# Patient Record
Sex: Male | Born: 1945 | Race: White | Hispanic: No | Marital: Married | Smoking: Never smoker
Health system: Southern US, Community
[De-identification: ages and names within clinical notes are randomized; demographics above are authoritative.]

## PROBLEM LIST (undated history)

## (undated) DIAGNOSIS — H409 Unspecified glaucoma: Secondary | ICD-10-CM

## (undated) DIAGNOSIS — J45909 Unspecified asthma, uncomplicated: Secondary | ICD-10-CM

## (undated) DIAGNOSIS — N400 Enlarged prostate without lower urinary tract symptoms: Secondary | ICD-10-CM

## (undated) DIAGNOSIS — E7800 Pure hypercholesterolemia, unspecified: Secondary | ICD-10-CM

## (undated) HISTORY — PX: TONSILLECTOMY: SUR1361

## (undated) HISTORY — PX: OTHER SURGICAL HISTORY: SHX169

---

## 2004-12-27 ENCOUNTER — Ambulatory Visit: Payer: Self-pay | Admitting: Internal Medicine

## 2005-01-10 ENCOUNTER — Ambulatory Visit: Payer: Self-pay | Admitting: Internal Medicine

## 2007-07-01 ENCOUNTER — Encounter: Payer: Self-pay | Admitting: Internal Medicine

## 2007-12-04 ENCOUNTER — Ambulatory Visit: Payer: Self-pay | Admitting: Gastroenterology

## 2007-12-10 ENCOUNTER — Ambulatory Visit: Payer: Self-pay | Admitting: Internal Medicine

## 2007-12-10 LAB — CONVERTED CEMR LAB
Albumin: 4 g/dL (ref 3.5–5.2)
Alkaline Phosphatase: 53 units/L (ref 39–117)
BUN: 20 mg/dL (ref 6–23)
Bilirubin Urine: NEGATIVE
Bilirubin, Direct: 0.1 mg/dL (ref 0.0–0.3)
Blood in Urine, dipstick: NEGATIVE
Calcium: 8.9 mg/dL (ref 8.4–10.5)
Cholesterol: 151 mg/dL (ref 0–200)
Eosinophils Absolute: 0.2 10*3/uL (ref 0.0–0.7)
GFR calc Af Amer: 110 mL/min
GFR calc non Af Amer: 91 mL/min
Glucose, Bld: 98 mg/dL (ref 70–99)
Glucose, Urine, Semiquant: NEGATIVE
HCT: 44.5 % (ref 39.0–52.0)
HDL: 29.8 mg/dL — ABNORMAL LOW (ref >39.0)
Hemoglobin: 15.2 g/dL (ref 13.0–17.0)
Ketones, urine, test strip: NEGATIVE
MCHC: 34.1 g/dL (ref 30.0–36.0)
MCV: 88.3 fL (ref 78.0–100.0)
Monocytes Absolute: 0.5 10*3/uL (ref 0.1–1.0)
Monocytes Relative: 8.2 % (ref 3.0–12.0)
Neutro Abs: 3.7 10*3/uL (ref 1.4–7.7)
Nitrite: NEGATIVE
Platelets: 151 10*3/uL (ref 150–400)
Potassium: 4.1 meq/L (ref 3.5–5.1)
RDW: 13.1 % (ref 11.5–14.6)
Sodium: 142 meq/L (ref 135–145)
Specific Gravity, Urine: 1.02
TSH: 2.05 microintl units/mL (ref 0.35–5.50)
Total Protein: 6.3 g/dL (ref 6.0–8.3)
Triglycerides: 61 mg/dL (ref 0–149)
pH: 7.5

## 2007-12-11 ENCOUNTER — Ambulatory Visit: Payer: Self-pay | Admitting: Gastroenterology

## 2007-12-21 ENCOUNTER — Ambulatory Visit: Payer: Self-pay | Admitting: Internal Medicine

## 2007-12-21 DIAGNOSIS — J309 Allergic rhinitis, unspecified: Secondary | ICD-10-CM | POA: Insufficient documentation

## 2007-12-21 DIAGNOSIS — E785 Hyperlipidemia, unspecified: Secondary | ICD-10-CM

## 2007-12-21 DIAGNOSIS — N400 Enlarged prostate without lower urinary tract symptoms: Secondary | ICD-10-CM | POA: Insufficient documentation

## 2008-01-06 ENCOUNTER — Encounter: Payer: Self-pay | Admitting: Internal Medicine

## 2008-02-17 ENCOUNTER — Encounter: Payer: Self-pay | Admitting: Internal Medicine

## 2008-02-29 ENCOUNTER — Encounter: Payer: Self-pay | Admitting: Internal Medicine

## 2009-06-13 ENCOUNTER — Encounter: Payer: Self-pay | Admitting: Internal Medicine

## 2009-12-01 ENCOUNTER — Ambulatory Visit: Payer: Self-pay | Admitting: Internal Medicine

## 2009-12-01 LAB — CONVERTED CEMR LAB
ALT: 19 units/L (ref 0–53)
BUN: 15 mg/dL (ref 6–23)
Basophils Absolute: 0 10*3/uL (ref 0.0–0.1)
Bilirubin Urine: NEGATIVE
Chloride: 104 meq/L (ref 96–112)
Cholesterol: 139 mg/dL (ref 0–200)
Creatinine, Ser: 0.8 mg/dL (ref 0.4–1.5)
Eosinophils Absolute: 0.2 10*3/uL (ref 0.0–0.7)
Glucose, Bld: 87 mg/dL (ref 70–99)
HCT: 41.5 % (ref 39.0–52.0)
Leukocytes, UA: NEGATIVE
Lymphs Abs: 1.4 10*3/uL (ref 0.7–4.0)
MCHC: 33.9 g/dL (ref 30.0–36.0)
MCV: 85.1 fL (ref 78.0–100.0)
Monocytes Absolute: 0.4 10*3/uL (ref 0.1–1.0)
Neutrophils Relative %: 60.7 % (ref 43.0–77.0)
Nitrite: NEGATIVE
PSA: 4.63 ng/mL — ABNORMAL HIGH (ref 0.10–4.00)
Platelets: 295 10*3/uL (ref 150.0–400.0)
Potassium: 4.3 meq/L (ref 3.5–5.1)
RDW: 15.4 % — ABNORMAL HIGH (ref 11.5–14.6)
Specific Gravity, Urine: >=1.03 (ref 1.000–1.030)
TSH: 1.74 microintl units/mL (ref 0.35–5.50)
Total Bilirubin: 0.4 mg/dL (ref 0.3–1.2)
Triglycerides: 39 mg/dL (ref 0.0–149.0)
pH: 5 (ref 5.0–8.0)

## 2009-12-12 ENCOUNTER — Encounter: Payer: Self-pay | Admitting: Internal Medicine

## 2009-12-12 ENCOUNTER — Ambulatory Visit: Payer: Self-pay | Admitting: Internal Medicine

## 2009-12-12 DIAGNOSIS — J4520 Mild intermittent asthma, uncomplicated: Secondary | ICD-10-CM | POA: Insufficient documentation

## 2009-12-12 DIAGNOSIS — G4710 Hypersomnia, unspecified: Secondary | ICD-10-CM

## 2009-12-20 ENCOUNTER — Encounter: Payer: Self-pay | Admitting: Internal Medicine

## 2009-12-27 ENCOUNTER — Ambulatory Visit: Payer: Self-pay | Admitting: Pulmonary Disease

## 2010-04-18 ENCOUNTER — Encounter: Payer: Self-pay | Admitting: Internal Medicine

## 2010-05-01 NOTE — Letter (Signed)
Summary: [facility]  [facility]   Imported By: Maryln Gottron 06/16/2009 13:51:20  _____________________________________________________________________  External Attachment:    Type:   Image     Comment:   External Document

## 2010-05-01 NOTE — Assessment & Plan Note (Signed)
Summary: NEW PT CPX/[employer]HC/SHINGLES SHOT/#/LB   Vital Signs:  Patient profile:   65 year old male Height:      70 inches Weight:      249.38 pounds BMI:     35.91 O2 Sat:      95 % on Room air Temp:     97.6 degrees F oral Pulse rate:   88 / minute BP sitting:   132 / 82  (left arm) Cuff size:   large  Vitals Entered By: Zella Ball Ewing CMA Duncan Dull) (December 12, 2009 9:39 AM)  O2 Flow:  Room air  CC: Adult Physical, New Patient/RE   CC:  Adult Physical and New Patient/RE.  History of Present Illness: here for wellness;  not much snoring overall but has gradually gained wt  pk wt was 261, now back to 249 wiht mod adkins diet;  Pt denies CP, worsening sob, doe, wheezing, orthopnea, pnd, worsening LE edema, palps, dizziness or syncope  Has someoccasional muscle aches .  Pt denies new neuro symptoms such as headache, facial or extremity weakness  No fever, wt loss, night sweats, loss of appetite or other constitutional symptoms  Has a walker and total gym at home, likes to hunt for deer in the fall.    Preventive Screening-Counseling & Management      Drug Use:  no.    Problems Prior to Update: 1)  Hyperlipidemia  (ICD-272.4) 2)  Benign Prostatic Hypertrophy  (ICD-600.00) 3)  Allergic Rhinitis  (ICD-477.9)  Medications Prior to Update: 1)  Flomax 0.4 Mg Xr24h-Cap (Tamsulosin Hcl) .Marland Kitchen.. 1 Once Daily 2)  Adult Aspirin Ec Low Strength 81 Mg Tbec (Aspirin) .Marland Kitchen.. 1 Once Daily 3)  Fish Oil 1000 Mg Caps (Omega-3 Fatty Acids)  Current Medications (verified): 1)  Flomax 0.4 Mg Xr24h-Cap (Tamsulosin Hcl) .Marland Kitchen.. 1 Once At Bedtime 2)  Adult Aspirin Ec Low Strength 81 Mg Tbec (Aspirin) .Marland Kitchen.. 1 Once Daily 3)  Fish Oil 1000 Mg Caps (Omega-3 Fatty Acids) 4)  Allergy Shots  Allergies (verified): No Known Drug Allergies  Past History:  Family History: Last updated: 01-02-08  father died age 74, history of hypertension, coronary artery disease mother died at 77, gastric cancer  One brother  died age 104 of MS  Social History: Last updated: 12/12/2009 Married Never Smoked Regular exercise-no one son with  grandchildren Alcohol use-no Drug use-no  Risk Factors: Exercise: no (Jan 02, 2008)  Risk Factors: Smoking Status: never (01-02-2008)  Past Medical History: Allergic rhinitis Benign prostatic hypertrophy (Tannenbaum) Hyperlipidemia Asthma Elevated PSA - Dr Patsi Sears [place]  Past Surgical History: Tonsillectomy s/p prostate biopsy x 2  - negative  Family History: Reviewed history from Jan 02, 2008 and no changes required.  father died age 53, history of hypertension, coronary artery disease mother died at 61, gastric cancer  One brother died age 23 of MS  Social History: Reviewed history from 2008/01/02 and no changes required. Married Never Smoked Regular exercise-no one son with  grandchildren Alcohol use-no Drug use-no Drug Use:  no  Review of Systems  The patient denies anorexia, fever, weight loss, weight gain, vision loss, decreased hearing, hoarseness, chest pain, syncope, dyspnea on exertion, peripheral edema, prolonged cough, headaches, hemoptysis, abdominal pain, melena, hematochezia, severe indigestion/heartburn, hematuria, muscle weakness, suspicious skin lesions, transient blindness, difficulty walking, depression, unusual weight change, abnormal bleeding, enlarged lymph nodes, and angioedema.         all otherwise negative per pt -  has appt sept 21; takes the flomax in the AM  Physical  Exam  General:  alert and overweight-appearing.   Head:  normocephalic and atraumatic.   Eyes:  vision grossly intact, pupils equal, and pupils round.   Ears:  R ear normal and L ear normal.   Nose:  no external deformity and no nasal discharge.   Mouth:  no gingival abnormalities and pharynx pink and moist.   Neck:  supple and no masses.   Lungs:  normal respiratory effort and normal breath sounds.   Heart:  normal rate and regular rhythm.     Abdomen:  soft, non-tender, and normal bowel sounds.   Msk:  no joint tenderness and no joint swelling.   Extremities:  no edema, no erythema  Neurologic:  cranial nerves II-XII intact and strength normal in all extremities.   Skin:  color normal and no rashes.   Psych:  not anxious appearing and not depressed appearing.     Impression & Recommendations:  Problem # 1:  Preventive Health Care (ICD-V70.0) Overall doing well, age appropriate education and counseling updated and referral for appropriate preventive services done unless declined, immunizations up to date or declined, diet counseling done if overweight, urged to quit smoking if smokes , most recent labs reviewed and current ordered if appropriate, ecg reviewed or declined (interpretation per ECG scanned in the EMR if done); information regarding Medicare Prevention requirements given if appropriate; speciality referrals updated as appropriate   Problem # 2:  HYPERSOMNIA (ICD-780.54)  ok to refer to pulm - ? OSA  Orders: Pulmonary Referral (Pulmonary)  Problem # 3:  BENIGN PROSTATIC HYPERTROPHY (ICD-600.00)  His updated medication list for this problem includes:    Flomax 0.4 Mg Xr24h-cap (Tamsulosin hcl) .Marland Kitchen... 1 once at bedtime with noctuiria x 4 per night, with sleep disruption - to start with taking the flomax at night, and f/u with urology later this motnh  Complete Medication List: 1)  Flomax 0.4 Mg Xr24h-cap (Tamsulosin hcl) .Marland Kitchen.. 1 once at bedtime 2)  Adult Aspirin Ec Low Strength 81 Mg Tbec (Aspirin) .Marland Kitchen.. 1 once daily 3)  Fish Oil 1000 Mg Caps (Omega-3 fatty acids) 4)  Allergy Shots   Other Orders: Tdap => 69yrs IM (16109) Admin 1st Vaccine (60454) Admin 1st Vaccine (09811) Flu Vaccine 64yrs + [phone]) EKG w/ Interpretation (93000)  Patient Instructions: 1)  you had the flu shot and tetanus shot today 2)  you had the shingles shot today 3)  Continue all previous medications as before this visit , except take  the flomax before bedtime to see if this helps getting up so much at night 4)  You will be contacted about the referral(s) to: pulmonary for the possible sleep apnea 5)  Please keep your appt with Urology as planned 6)  Your labs were faxed to urology today 7)  Please schedule a follow-up appointment in 1 year or sooner if needed   Immunizations Administered:  Tetanus Vaccine:    Vaccine Type: Tdap    Site: right deltoid    Mfr: GlaxoSmithKline    Dose: 0.5 ml    Route: IM    Given by: Zella Ball Ewing CMA (AAMA)    Exp. Date: 09/29/2011    Lot #: [id number]    VIS given: 02/17/08 version given December 12, 2009.    Flu Vaccine Consent Questions     Do you have a history of severe allergic reactions to this vaccine? no    Any prior history of allergic reactions to egg and/or gelatin? no    Do you  have a sensitivity to the preservative Thimersol? no    Do you have a past history of Guillan-Barre Syndrome? no    Do you currently have an acute febrile illness? no    Have you ever had a severe reaction to latex? no    Vaccine information given and explained to patient? yes    Are you currently pregnant? no    Lot Number:[id number]   Exp Date:09/29/2010   Site Given  Left Deltoid IMlu    Immunizations Administered:  Tetanus Vaccine:    Vaccine Type: Tdap    Site: right deltoid    Mfr: GlaxoSmithKline    Dose: 0.5 ml    Route: IM    Given by: Zella Ball Ewing CMA (AAMA)    Exp. Date: 09/29/2011    Lot #: [id number]    VIS given: 02/17/08 version given December 12, 2009.   Appended Document: Immunization Entry      Immunizations Administered:  Zostavax # 1:    Vaccine Type: Zostavax    Site: right deltoid    Mfr: Merck    Dose: 0.5 ml    Route: [place]    Given by: Zella Ball Ewing CMA (AAMA)    Exp. Date: 10/25/2010    Lot #: [id number]    VIS given: 01/11/05 given December 12, 2009.

## 2010-05-01 NOTE — Letter (Signed)
Summary: [facility]  [facility]   Imported By: Lennie Odor 12/25/2009 10:28:54  _____________________________________________________________________  External Attachment:    Type:   Image     Comment:   External Document

## 2010-05-01 NOTE — Assessment & Plan Note (Signed)
Summary: HYPERSOMNIA/APC   Visit Type:  Initial Consult Copy to:  Dr. Jonny Ruiz Primary Provider/Referring Provider:  Dr. Oliver Barre  CC:  Sleep consult...epworth score is 3..  History of Present Illness: 65/M for evaluation of obstructive sleep apnea . Seems that he was referred mainly for nocturia  - he attributes to enlarged prostate Epworth Sleepiness Score 3/24 naps on weekends, wife has not noted snoring 3-4 awakenings for nocturia  sleeps on his back or right side, 2 pillows, oob at 0530 - sluggish, dry mouth, 1 cup hot tea in am, 2-3 cans of soda, iced tea, no coffee, lost 25 lbs in  6 months  Preventive Screening-Counseling & Management  Alcohol-Tobacco     Alcohol drinks/day: 0     Smoking Status: never  Pulmonary Sleep Consultation Name CLIFFTON SPRADLEY DOB [birth year] MRN [id number] Date 12/27/2009  History of Present Illness: Enlarged prostate...up 3 to 4 times from sleep to urinate  What time do you typically go to bed?(between what hours): 9:30pm to 10:30pm  How long does it take you to fall asleep? 15 minutes  How many times during the night do you wake up? 3 or 4  What time do you get out of bed to start your day? 5:30am  Do you drive or operate heavy machinery in your occupation? no  How much has your weight changed (up or down) over the past two years? (in pounds): down 25 lbs with diet change  Have you ever had a sleep study before?  If yes,when and where: no  Do you currently use CPAP ? If so , at what pressure? no  Do you wear oxygen at any time? If yes, how many liters per minute? no Current Medications (verified): 1)  Flomax 0.4 Mg Xr24h-Cap (Tamsulosin Hcl) .Marland Kitchen.. 1 By Mouth Two Times A Day 2)  Adult Aspirin Ec Low Strength 81 Mg Tbec (Aspirin) .Marland Kitchen.. 1 By Mouth Daily 3)  Fish Oil 1000 Mg Caps (Omega-3 Fatty Acids) .Marland Kitchen.. 1 By Mouth Daily 4)  Allergy Shots 5)  Osteo Bi-Flex Adv Joint Shield  Tabs (Misc Natural Products) .Marland Kitchen.. 1 By Mouth Daily 6)  [facility]vs  Spectravite Senior  Tabs (Multiple Vitamins-Minerals) .Marland Kitchen.. 1 By Mouth Daily 7)  Vit E-Vit C-Beta Carotene [phone] Tabs (Multiple Vitamin) .Marland Kitchen.. 1 By Mouth Daily 8)  Acidophilus Probiotic 100 Mg Caps (Lactobacillus) .Marland Kitchen.. 1 By Mouth Daily 9)  Vitamin D3 1000 Unit Caps (Cholecalciferol) .Marland Kitchen.. 1 By Mouth Daily 10)  Vitamin B-12 1000 Mcg Tabs (Cyanocobalamin) .Marland Kitchen.. 1 By Mouth Daily 11)  Prostavar .Marland Kitchen.. 1 or 2 By Mouth Daily  Allergies (verified): No Known Drug Allergies  Past History:  Past Medical History: Last updated: 12/12/2009 Allergic rhinitis Benign prostatic hypertrophy ([place]annenbaum) Hyperlipidemia Asthma Elevated PSA - Dr Patsi Sears [place]  Past Surgical History: Last updated: 12/12/2009 Tonsillectomy s/p prostate biopsy x 2  - negative  Social History: Married Never Smoked Regular exercise-no one son with  grandchildren Alcohol use-no Drug use-no works as a Arboriculturist drinks/day:  0  Review of Systems       The patient complains of shortness of breath with activity, weight change, and joint stiffness or pain.  The patient denies shortness of breath at rest, productive cough, non-productive cough, coughing up blood, chest pain, irregular heartbeats, acid heartburn, indigestion, loss of appetite, abdominal pain, difficulty swallowing, sore throat, tooth/dental problems, headaches, nasal congestion/difficulty breathing through nose, sneezing, itching, ear ache, anxiety, depression, hand/feet swelling, rash, change in color of mucus, and fever.  Vital Signs:  Patient profile:   65 year old male Height:      70 inches (177.80 cm) Weight:      251.50 pounds (114.32 kg) BMI:     36.22 O2 Sat:      96 % on Room air Temp:     98.1 degrees F (36.72 degrees C) oral Pulse rate:   86 / minute BP sitting:   118 / 80  (left arm) Cuff size:   large  Vitals Entered By: Michel Bickers CMA (December 27, 2009 9:55 AM)  O2 Sat at Rest %:  96 O2 Flow:  Room air CC:  Sleep consult...epworth score is 3. Comments Medications reviewed with patient Daytime phone verified. Michel Bickers [facility]  December 27, 2009 9:56 AM   Physical Exam  Additional Exam:  wt 251 December 29, 2009  Gen. Pleasant, well-nourished, in no distress, normal affect ENT - no lesions, no post nasal drip, class 2 airway Neck: No JVD, no thyromegaly, no carotid bruits Lungs: no use of accessory muscles, no dullness to percussion, clear without rales or rhonchi  Cardiovascular: Rhythm regular, heart sounds  normal, no murmurs or gallops, no peripheral edema Abdomen: soft and non-tender, no hepatosplenomegaly, BS normal. Musculoskeletal: No deformities, no cyanosis or clubbing Neuro:  alert, non focal     Impression & Recommendations:  Problem # 1:  HYPERSOMNIA (ICD-780.54)  The pathophysiology of obstructive sleep apnea, it's cardiovascular consequences and modes of treatment including CPAP were discussed with the patient in great detail.  After discussion with him,  We will not pursue a sleep study at this time - if any red flags show up, let us know - snoring, excessive daytime sleepiness, frequent BR visits that your urologist thinks is not because of your prostate Continue weight loss program If wife has concerns , she wil call to provide further objective history  Orders: Consultation Level III (16109)  Medications Added to Medication List This Visit: 1)  Flomax 0.4 Mg Xr24h-cap (Tamsulosin hcl) .Marland Kitchen.. 1 by mouth two times a day 2)  Adult Aspirin Ec Low Strength 81 Mg Tbec (Aspirin) .Marland Kitchen.. 1 by mouth daily 3)  Fish Oil 1000 Mg Caps (Omega-3 fatty acids) .Marland Kitchen.. 1 by mouth daily 4)  Osteo Bi-flex Adv Joint Shield Tabs (Misc natural products) .Marland Kitchen.. 1 by mouth daily 5)  Cvs Spectravite Senior Tabs (Multiple vitamins-minerals) .Marland Kitchen.. 1 by mouth daily 6)  Vit E-vit C-beta Carotene [phone] Tabs (Multiple vitamin) .Marland Kitchen.. 1 by mouth daily 7)  Acidophilus Probiotic 100 Mg Caps  (Lactobacillus) .Marland Kitchen.. 1 by mouth daily 8)  Vitamin D3 1000 Unit Caps (Cholecalciferol) .Marland Kitchen.. 1 by mouth daily 9)  Vitamin B-12 1000 Mcg Tabs (Cyanocobalamin) .Marland Kitchen.. 1 by mouth daily 10)  Prostavar  .Marland Kitchen.. 1 or 2 by mouth daily  Patient Instructions: 1)  Copy sent to: dr Jonny Ruiz 2)  Please schedule a follow-up appointment as needed. 3)  We will not pursue a sleep study at this time - if any red flags show up, let us know - snoring, excessive daytime sleepiness, frequent BR visits that your urologist thinks is not because of your prostate 4)  Continue weight los program 5)  Let us know if your wife notes that you snore loud or stop breathing in your sleep

## 2010-05-03 NOTE — Letter (Signed)
Summary: [facility]  [facility]   Imported By: Lennie Odor 04/25/2010 14:29:09  _____________________________________________________________________  External Attachment:    Type:   Image     Comment:   External Document

## 2014-04-11 ENCOUNTER — Ambulatory Visit (INDEPENDENT_AMBULATORY_CARE_PROVIDER_SITE_OTHER): Payer: 59 | Admitting: *Deleted

## 2014-04-11 ENCOUNTER — Ambulatory Visit: Payer: 59 | Admitting: *Deleted

## 2014-04-11 DIAGNOSIS — Z23 Encounter for immunization: Secondary | ICD-10-CM

## 2015-01-04 ENCOUNTER — Telehealth: Payer: Self-pay | Admitting: Internal Medicine

## 2015-01-04 LAB — HM DIABETES EYE EXAM

## 2015-01-04 NOTE — Telephone Encounter (Signed)
Patient wants to reestablish with [place] and has asked if you can take him on as a new patient.  Please advise

## 2015-01-07 NOTE — Telephone Encounter (Signed)
Yes, ok with me 

## 2015-01-09 NOTE — Telephone Encounter (Signed)
appt made

## 2015-01-18 ENCOUNTER — Other Ambulatory Visit (INDEPENDENT_AMBULATORY_CARE_PROVIDER_SITE_OTHER): Payer: Medicare Other

## 2015-01-18 ENCOUNTER — Encounter: Payer: Self-pay | Admitting: Internal Medicine

## 2015-01-18 ENCOUNTER — Ambulatory Visit (INDEPENDENT_AMBULATORY_CARE_PROVIDER_SITE_OTHER): Payer: Medicare Other | Admitting: Internal Medicine

## 2015-01-18 VITALS — BP 120/70 | HR 80 | Temp 97.6°F | Resp 16 | Ht 70.0 in | Wt 273.0 lb

## 2015-01-18 DIAGNOSIS — E669 Obesity, unspecified: Secondary | ICD-10-CM

## 2015-01-18 DIAGNOSIS — E785 Hyperlipidemia, unspecified: Secondary | ICD-10-CM | POA: Diagnosis not present

## 2015-01-18 DIAGNOSIS — J4520 Mild intermittent asthma, uncomplicated: Secondary | ICD-10-CM

## 2015-01-18 DIAGNOSIS — Z23 Encounter for immunization: Secondary | ICD-10-CM

## 2015-01-18 DIAGNOSIS — Z1211 Encounter for screening for malignant neoplasm of colon: Secondary | ICD-10-CM

## 2015-01-18 DIAGNOSIS — N400 Enlarged prostate without lower urinary tract symptoms: Secondary | ICD-10-CM

## 2015-01-18 DIAGNOSIS — Z0000 Encounter for general adult medical examination without abnormal findings: Secondary | ICD-10-CM | POA: Diagnosis not present

## 2015-01-18 DIAGNOSIS — G4710 Hypersomnia, unspecified: Secondary | ICD-10-CM

## 2015-01-18 DIAGNOSIS — R0609 Other forms of dyspnea: Secondary | ICD-10-CM

## 2015-01-18 LAB — COMPREHENSIVE METABOLIC PANEL
ALT: 20 U/L (ref 0–53)
AST: 17 U/L (ref 0–37)
Albumin: 4.5 g/dL (ref 3.5–5.2)
Alkaline Phosphatase: 72 U/L (ref 39–117)
BILIRUBIN TOTAL: 0.5 mg/dL (ref 0.2–1.2)
BUN: 20 mg/dL (ref 6–23)
CALCIUM: 9.6 mg/dL (ref 8.4–10.5)
CHLORIDE: 106 meq/L (ref 96–112)
CO2: 26 meq/L (ref 19–32)
Creatinine, Ser: 0.87 mg/dL (ref 0.40–1.50)
GFR: 92.22 mL/min (ref >60.00)
Glucose, Bld: 97 mg/dL (ref 70–99)
POTASSIUM: 4.2 meq/L (ref 3.5–5.1)
Sodium: 141 mEq/L (ref 135–145)
Total Protein: 7 g/dL (ref 6.0–8.3)

## 2015-01-18 LAB — CBC WITH DIFFERENTIAL/PLATELET
BASOS ABS: 0 10*3/uL (ref 0.0–0.1)
Basophils Relative: 0.4 % (ref 0.0–3.0)
Eosinophils Absolute: 0.1 10*3/uL (ref 0.0–0.7)
Eosinophils Relative: 1.9 % (ref 0.0–5.0)
HEMATOCRIT: 48 % (ref 39.0–52.0)
Hemoglobin: 15.9 g/dL (ref 13.0–17.0)
LYMPHS PCT: 25.9 % (ref 12.0–46.0)
Lymphs Abs: 1.9 10*3/uL (ref 0.7–4.0)
MCHC: 33.2 g/dL (ref 30.0–36.0)
MCV: 88.9 fl (ref 78.0–100.0)
MONOS PCT: 8.1 % (ref 3.0–12.0)
Monocytes Absolute: 0.6 10*3/uL (ref 0.1–1.0)
NEUTROS ABS: 4.7 10*3/uL (ref 1.4–7.7)
Neutrophils Relative %: 63.7 % (ref 43.0–77.0)
PLATELETS: 254 10*3/uL (ref 150.0–400.0)
RBC: 5.39 Mil/uL (ref 4.22–5.81)
RDW: 13.6 % (ref 11.5–15.5)
WBC: 7.3 10*3/uL (ref 4.0–10.5)

## 2015-01-18 LAB — LIPID PANEL
CHOLESTEROL: 183 mg/dL (ref 0–200)
HDL: 41.9 mg/dL (ref >39.00)
LDL Cholesterol: 121 mg/dL — ABNORMAL HIGH (ref 0–99)
NonHDL: 140.69
TRIGLYCERIDES: 99 mg/dL (ref 0.0–149.0)
Total CHOL/HDL Ratio: 4
VLDL: 19.8 mg/dL (ref 0.0–40.0)

## 2015-01-18 LAB — HEMOGLOBIN A1C: HEMOGLOBIN A1C: 6 % (ref 4.6–6.5)

## 2015-01-18 LAB — TSH: TSH: 2.37 u[IU]/mL (ref 0.35–4.50)

## 2015-01-18 LAB — PSA: PSA: 4.7 ng/mL — ABNORMAL HIGH (ref 0.10–4.00)

## 2015-01-18 NOTE — Patient Instructions (Signed)

## 2015-01-18 NOTE — Progress Notes (Signed)
Subjective:  Patient ID: Mario Donovan, male    DOB: 1945/05/02  Age: 69 y.o. MRN: [id number]  CC: Annual Exam and Fatigue  New to me for a CPX   HPI CORDARRIUS COAD presents for a CPX but also complains of fatigue and mild DOE for several years.   No outpatient prescriptions prior to visit.   No facility-administered medications prior to visit.    ROS Review of Systems  Constitutional: Positive for fatigue and unexpected weight change (some wt gain). Negative for fever, chills, diaphoresis, activity change and appetite change.  HENT: Negative.   Eyes: Negative.   Respiratory: Positive for shortness of breath. Negative for apnea, cough, choking, chest tightness, wheezing and stridor.        His wife says he does not snore and she has not seen any evidence of sleep apnea but she tells me that he is a "light sleeper"  Cardiovascular: Negative.  Negative for chest pain, palpitations and leg swelling.  Gastrointestinal: Negative.  Negative for nausea, vomiting, abdominal pain, diarrhea, constipation and blood in stool.  Endocrine: Negative.   Genitourinary: Negative.   Musculoskeletal: Negative.  Negative for myalgias, back pain and joint swelling.  Skin: Negative.  Negative for color change and rash.  Allergic/Immunologic: Negative.   Neurological: Negative.   Hematological: Negative.  Negative for adenopathy. Does not bruise/bleed easily.  Psychiatric/Behavioral: Negative.  Negative for sleep disturbance, dysphoric mood and agitation. The patient is not nervous/anxious.     Objective:  BP 120/70 mmHg  Pulse 80  Temp(Src) 97.6 F (36.4 C) (Oral)  Resp 16  Ht 5\' 10"  (1.778 m)  Wt 273 lb (123.832 kg)  BMI 39.17 kg/m2  SpO2 96%  BP Readings from Last 3 Encounters:  01/18/15 120/70  12/27/09 118/80  12/12/09 132/82    Wt Readings from Last 3 Encounters:  01/18/15 273 lb (123.832 kg)  12/27/09 251 lb 8 oz (114.08 kg)  12/12/09 249 lb 6.1 oz (113.119 kg)     Physical Exam  Constitutional: He is oriented to person, place, and time. He appears well-developed and well-nourished. No distress.  HENT:  Head: Normocephalic and atraumatic.  Mouth/Throat: Oropharynx is clear and moist.  Eyes: Conjunctivae are normal. Right eye exhibits no discharge. Left eye exhibits no discharge. No scleral icterus.  Neck: Normal range of motion. Neck supple. No JVD present. No tracheal deviation present. No thyromegaly present.  Cardiovascular: Normal rate, regular rhythm, normal heart sounds and intact distal pulses.  Exam reveals no gallop and no friction rub.   No murmur heard. Pulses:      Carotid pulses are 1+ on the right side, and 1+ on the left side.      Radial pulses are 1+ on the right side, and 1+ on the left side.       Femoral pulses are 1+ on the right side, and 1+ on the left side.      Popliteal pulses are 1+ on the right side, and 1+ on the left side.       Dorsalis pedis pulses are 1+ on the right side, and 1+ on the left side.       Posterior tibial pulses are 1+ on the right side, and 1+ on the left side.  EKG - + artifact but shows NSR with no LVH, Q waves or ST/T wave changes  Pulmonary/Chest: Effort normal and breath sounds normal. No stridor. No respiratory distress. He has no wheezes. He has no rales. He exhibits  no tenderness.  Abdominal: Soft. Bowel sounds are normal. He exhibits no distension and no mass. There is no tenderness. There is no rebound and no guarding.  Genitourinary:  He refused GU and rectal exams, he tells me that he will see his urologist on Nov 2 and will have the exams done then  Musculoskeletal: Normal range of motion. He exhibits no edema or tenderness.  Lymphadenopathy:    He has no cervical adenopathy.  Neurological: He is oriented to person, place, and time.  Skin: Skin is warm and dry. No rash noted. He is not diaphoretic. No erythema. No pallor.  Psychiatric: He has a normal mood and affect. His behavior is  normal. Judgment and thought content normal.  Vitals reviewed.   Lab Results  Component Value Date   WBC 7.3 01/18/2015   HGB 15.9 01/18/2015   HCT 48.0 01/18/2015   PLT 254.0 01/18/2015   GLUCOSE 97 01/18/2015   CHOL 183 01/18/2015   TRIG 99.0 01/18/2015   HDL 41.90 01/18/2015   LDLCALC 121* 01/18/2015   ALT 20 01/18/2015   AST 17 01/18/2015   NA 141 01/18/2015   K 4.2 01/18/2015   CL 106 01/18/2015   CREATININE 0.87 01/18/2015   BUN 20 01/18/2015   CO2 26 01/18/2015   TSH 2.37 01/18/2015   PSA 4.70* 01/18/2015   HGBA1C 6.0 01/18/2015    No results found.  Assessment & Plan:   Caidin was seen today for annual exam and fatigue.  Diagnoses and all orders for this visit:  BPH (benign prostatic hyperplasia)- PSA is slightly elevated, he sees his urologist soon -     PSA; Future -     Urinalysis, Routine w reflex microscopic (not at [facility]); Future  Hyperlipidemia with target LDL less than 130- Framingham risk score is 12% so will ask him to start a statin -     Lipid panel; Future -     Comprehensive metabolic panel; Future -     CBC with Differential/Platelet; Future -     TSH; Future  Asthma, mild intermittent, well-controlled -     CBC with Differential/Platelet; Future  Routine general medical examination at a health care facility -     Hepatitis C antibody; Future -     EKG 12-Lead  HYPERSOMNIA  Obesity (BMI 35.0-39.9 without comorbidity) ([place])- he agrees to work on his lifestyle modifications -     Hemoglobin A1c; Future  Colon cancer screening -     Ambulatory referral to Gastroenterology  Need for influenza vaccination -     Flu Vaccine QUAD 36+ mos IM  Need for vaccination with 13-polyvalent pneumococcal conjugate vaccine -     Pneumococcal conjugate vaccine 13-valent  DOE (dyspnea on exertion)- EKG is normal, this may be his poor conditioning and obesity but he has a FH of CAD so I have asked him to have an ETT done to screen for CAD -      Exercise Tolerance Test; Future  See AVS for instructions about healthy living and anticipatory guidance. I am having Mr. Heal maintain his SYMBICORT, tamsulosin, aspirin, diphenhydrAMINE, multivitamin, and Safflower Oil.  Meds ordered this encounter  Medications  . SYMBICORT 160-4.5 MCG/ACT inhaler    Sig: INHALE 2 PUFFS TWICE A DAY INHALATION    Refill:  0  . tamsulosin (FLOMAX) 0.4 MG CAPS capsule    Sig: TAKE 2 CAPSULE DAILY    Refill:  8  . aspirin 81 MG tablet    Sig:  Take 81 mg by mouth daily.  . diphenhydrAMINE (BENADRYL) 25 mg capsule    Sig: Take 25 mg by mouth every 6 (six) hours as needed.  . Multiple Vitamin (MULTIVITAMIN) tablet    Sig: Take 1 tablet by mouth daily.  . Safflower Oil OIL    Sig: Take by mouth daily.     Follow-up: Return in about 3 months (around 04/20/2015).  Scarlette Calico, MD

## 2015-01-18 NOTE — Progress Notes (Signed)
Pre visit review using our clinic review tool, if applicable. No additional management support is needed unless otherwise documented below in the visit note. 

## 2015-01-19 ENCOUNTER — Encounter: Payer: Self-pay | Admitting: Internal Medicine

## 2015-01-19 DIAGNOSIS — R0609 Other forms of dyspnea: Secondary | ICD-10-CM

## 2015-01-19 LAB — HEPATITIS C ANTIBODY: HCV AB: NEGATIVE

## 2015-01-19 MED ORDER — ATORVASTATIN CALCIUM 20 MG PO TABS: 20.0000 mg | ORAL_TABLET | Freq: Every day | ORAL | Status: DC

## 2015-01-19 NOTE — Assessment & Plan Note (Signed)

## 2015-01-20 ENCOUNTER — Encounter: Payer: Self-pay | Admitting: Internal Medicine

## 2015-02-14 ENCOUNTER — Telehealth (HOSPITAL_COMMUNITY): Payer: Self-pay

## 2015-02-14 NOTE — Telephone Encounter (Signed)
Encounter complete. 

## 2015-02-15 ENCOUNTER — Telehealth (HOSPITAL_COMMUNITY): Payer: Self-pay

## 2015-02-15 NOTE — Telephone Encounter (Signed)
Encounter complete. 

## 2015-02-17 ENCOUNTER — Ambulatory Visit (HOSPITAL_COMMUNITY)
Admission: RE | Admit: 2015-02-17 | Discharge: 2015-02-17 | Disposition: A | Discharge status: Home / Self Care | Payer: Medicare Other | Source: Ambulatory Visit | Attending: Cardiovascular Disease | Admitting: Cardiovascular Disease

## 2015-02-17 ENCOUNTER — Encounter: Payer: Self-pay | Admitting: Internal Medicine

## 2015-02-17 DIAGNOSIS — R0609 Other forms of dyspnea: Secondary | ICD-10-CM | POA: Diagnosis present

## 2015-02-17 LAB — EXERCISE TOLERANCE TEST
CSEPEW: 10.4 METS
CSEPHR: 103 %
CSEPPHR: 155 {beats}/min
Exercise duration (min): 9 min
Exercise duration (sec): 1 s
MPHR: 151 {beats}/min
RPE: 17
Rest HR: 83 {beats}/min

## 2015-02-22 ENCOUNTER — Other Ambulatory Visit: Payer: Self-pay | Admitting: Internal Medicine

## 2015-02-22 DIAGNOSIS — R7303 Prediabetes: Secondary | ICD-10-CM

## 2015-02-22 DIAGNOSIS — E785 Hyperlipidemia, unspecified: Secondary | ICD-10-CM

## 2015-03-13 ENCOUNTER — Other Ambulatory Visit: Payer: Self-pay | Admitting: Urology

## 2015-03-15 ENCOUNTER — Encounter: Payer: Self-pay | Admitting: Internal Medicine

## 2015-03-15 ENCOUNTER — Ambulatory Visit (INDEPENDENT_AMBULATORY_CARE_PROVIDER_SITE_OTHER): Payer: Medicare Other | Admitting: Internal Medicine

## 2015-03-15 VITALS — BP 118/78 | HR 78 | Temp 97.8°F | Resp 16 | Ht 70.0 in | Wt 264.0 lb

## 2015-03-15 DIAGNOSIS — J4520 Mild intermittent asthma, uncomplicated: Secondary | ICD-10-CM

## 2015-03-15 DIAGNOSIS — Z23 Encounter for immunization: Secondary | ICD-10-CM

## 2015-03-15 DIAGNOSIS — E785 Hyperlipidemia, unspecified: Secondary | ICD-10-CM

## 2015-03-15 NOTE — Progress Notes (Signed)
Subjective:  Patient ID: Mario Donovan, male    DOB: 10-04-1945  Age: 69 y.o. MRN: [id number]  CC: Asthma   HPI ANDRO GROSSER presents for an asthma follow-up. The nocturnal wheezing has resolved with the use of Symbicort. He feels well today and offers no complaints.  Outpatient Prescriptions Prior to Visit  Medication Sig Dispense Refill  . aspirin 81 MG tablet Take 81 mg by mouth daily.    Marland Kitchen atorvastatin (LIPITOR) 20 MG tablet Take 1 tablet (20 mg total) by mouth daily. 90 tablet 3  . diphenhydrAMINE (BENADRYL) 25 mg capsule Take 25 mg by mouth every 6 (six) hours as needed.    . Multiple Vitamin (MULTIVITAMIN) tablet Take 1 tablet by mouth daily.    . Safflower Oil OIL Take by mouth daily.    . SYMBICORT 160-4.5 MCG/ACT inhaler INHALE 2 PUFFS TWICE A DAY INHALATION  0  . tamsulosin (FLOMAX) 0.4 MG CAPS capsule TAKE 2 CAPSULE DAILY  8   No facility-administered medications prior to visit.    ROS Review of Systems  Constitutional: Negative.  Negative for fever, chills, diaphoresis, appetite change and fatigue.  HENT: Negative.   Eyes: Negative.   Respiratory: Negative.  Negative for cough, choking, chest tightness, shortness of breath and stridor.   Cardiovascular: Negative.  Negative for chest pain, palpitations and leg swelling.  Gastrointestinal: Negative.  Negative for nausea, vomiting, abdominal pain and diarrhea.  Endocrine: Negative.   Genitourinary: Negative.   Musculoskeletal: Negative.  Negative for myalgias, joint swelling and arthralgias.  Skin: Negative.   Allergic/Immunologic: Negative.   Neurological: Negative.   Hematological: Negative.  Negative for adenopathy. Does not bruise/bleed easily.  Psychiatric/Behavioral: Negative.     Objective:  BP 118/78 mmHg  Pulse 78  Temp(Src) 97.8 F (36.6 C) (Oral)  Resp 16  Ht 5\' 10"  (1.778 m)  Wt 264 lb (119.75 kg)  BMI 37.88 kg/m2  SpO2 98%  BP Readings from Last 3 Encounters:  03/15/15 118/78    01/18/15 120/70  12/27/09 118/80    Wt Readings from Last 3 Encounters:  03/15/15 264 lb (119.75 kg)  01/18/15 273 lb (123.832 kg)  12/27/09 251 lb 8 oz (114.08 kg)    Physical Exam  Constitutional: He is oriented to person, place, and time. No distress.  HENT:  Head: Normocephalic and atraumatic.  Mouth/Throat: Oropharynx is clear and moist. No oropharyngeal exudate.  Eyes: Conjunctivae are normal. Right eye exhibits no discharge. Left eye exhibits no discharge. No scleral icterus.  Neck: Normal range of motion. Neck supple. No JVD present. No tracheal deviation present. No thyromegaly present.  Cardiovascular: Normal rate, regular rhythm, normal heart sounds and intact distal pulses.  Exam reveals no gallop and no friction rub.   No murmur heard. Pulmonary/Chest: Effort normal and breath sounds normal. No stridor. No respiratory distress. He has no wheezes. He has no rales. He exhibits no tenderness.  Abdominal: Soft. Bowel sounds are normal. He exhibits no distension and no mass. There is no tenderness. There is no rebound and no guarding.  Musculoskeletal: Normal range of motion. He exhibits no edema or tenderness.  Lymphadenopathy:    He has no cervical adenopathy.  Neurological: He is oriented to person, place, and time.  Skin: Skin is warm and dry. No rash noted. He is not diaphoretic. No erythema. No pallor.  Vitals reviewed.   Lab Results  Component Value Date   WBC 7.3 01/18/2015   HGB 15.9 01/18/2015   HCT 48.0  01/18/2015   PLT 254.0 01/18/2015   GLUCOSE 97 01/18/2015   CHOL 183 01/18/2015   TRIG 99.0 01/18/2015   HDL 41.90 01/18/2015   LDLCALC 121* 01/18/2015   ALT 20 01/18/2015   AST 17 01/18/2015   NA 141 01/18/2015   K 4.2 01/18/2015   CL 106 01/18/2015   CREATININE 0.87 01/18/2015   BUN 20 01/18/2015   CO2 26 01/18/2015   TSH 2.37 01/18/2015   PSA 4.70* 01/18/2015   HGBA1C 6.0 01/18/2015    No results found.  Assessment & Plan:   Vearl was  seen today for asthma.  Diagnoses and all orders for this visit:  Asthma, mild intermittent, well-controlled- this is well-controlled with a LABA/ICS combination  Need for 23-polyvalent pneumococcal polysaccharide vaccine -     Pneumococcal polysaccharide vaccine 23-valent greater than or equal to 2yo subcutaneous/IM  Hyperlipidemia with target LDL less than 130- he is doing well on the statin   I am having Mr. Hecht maintain his SYMBICORT, tamsulosin, aspirin, diphenhydrAMINE, multivitamin, Safflower Oil, atorvastatin, latanoprost, and dorzolamide.  Meds ordered this encounter  Medications  . latanoprost (XALATAN) 0.005 % ophthalmic solution    Sig: PLACE 1 DROP INTO BOTH EYES AT BEDTIME    Refill:  12  . dorzolamide (TRUSOPT) 2 % ophthalmic solution    Sig: PLACE 1 DROP INTO BOTH EYES 3 TIMES A DAY    Refill:  11     Follow-up: No Follow-up on file.  Scarlette Calico, MD

## 2015-03-15 NOTE — Progress Notes (Signed)
Pre visit review using our clinic review tool, if applicable. No additional management support is needed unless otherwise documented below in the visit note. 

## 2015-03-16 NOTE — Patient Instructions (Signed)
Asthma, Adult Asthma is a recurring condition in which the airways tighten and narrow. Asthma can make it difficult to breathe. It can cause coughing, wheezing, and shortness of breath. Asthma episodes, also called asthma attacks, range from minor to life-threatening. Asthma cannot be cured, but medicines and lifestyle changes can help control it. CAUSES Asthma is believed to be caused by inherited (genetic) and environmental factors, but its exact cause is unknown. Asthma may be triggered by allergens, lung infections, or irritants in the air. Asthma triggers are different for each person. Common triggers include:   Animal dander.  Dust mites.  Cockroaches.  Pollen from trees or grass.  Mold.  Smoke.  Air pollutants such as dust, household cleaners, hair sprays, aerosol sprays, paint fumes, strong chemicals, or strong odors.  Cold air, weather changes, and winds (which increase molds and pollens in the air).  Strong emotional expressions such as crying or laughing hard.  Stress.  Certain medicines (such as aspirin) or types of drugs (such as beta-blockers).  Sulfites in foods and drinks. Foods and drinks that may contain sulfites include dried fruit, potato chips, and sparkling grape juice.  Infections or inflammatory conditions such as the flu, a cold, or an inflammation of the nasal membranes (rhinitis).  Gastroesophageal reflux disease (GERD).  Exercise or strenuous activity. SYMPTOMS Symptoms may occur immediately after asthma is triggered or many hours later. Symptoms include:  Wheezing.  Excessive nighttime or early morning coughing.  Frequent or severe coughing with a common cold.  Chest tightness.  Shortness of breath. DIAGNOSIS  The diagnosis of asthma is made by a review of your medical history and a physical exam. Tests may also be performed. These may include:  Lung function studies. These tests show how much air you breathe in and out.  Allergy  tests.  Imaging tests such as X-rays. TREATMENT  Asthma cannot be cured, but it can usually be controlled. Treatment involves identifying and avoiding your asthma triggers. It also involves medicines. There are 2 classes of medicine used for asthma treatment:   Controller medicines. These prevent asthma symptoms from occurring. They are usually taken every day.  Reliever or rescue medicines. These quickly relieve asthma symptoms. They are used as needed and provide short-term relief. Your health care provider will help you create an asthma action plan. An asthma action plan is a written plan for managing and treating your asthma attacks. It includes a list of your asthma triggers and how they may be avoided. It also includes information on when medicines should be taken and when their dosage should be changed. An action plan may also involve the use of a device called a peak flow meter. A peak flow meter measures how well the lungs are working. It helps you monitor your condition. HOME CARE INSTRUCTIONS   Take medicines only as directed by your health care provider. Speak with your health care provider if you have questions about how or when to take the medicines.  Use a peak flow meter as directed by your health care provider. Record and keep track of readings.  Understand and use the action plan to help minimize or stop an asthma attack without needing to seek medical care.  Control your home environment in the following ways to help prevent asthma attacks:  Do not smoke. Avoid being exposed to secondhand smoke.  Change your heating and air conditioning filter regularly.  Limit your use of fireplaces and wood stoves.  Get rid of pests (such as roaches   and mice) and their droppings.  Throw away plants if you see mold on them.  Clean your floors and dust regularly. Use unscented cleaning products.  Try to have someone else vacuum for you regularly. Stay out of rooms while they are  being vacuumed and for a short while afterward. If you vacuum, use a dust mask from a hardware store, a double-layered or microfilter vacuum cleaner bag, or a vacuum cleaner with a HEPA filter.  Replace carpet with wood, tile, or vinyl flooring. Carpet can trap dander and dust.  Use allergy-proof pillows, mattress covers, and box spring covers.  Wash bed sheets and blankets every week in hot water and dry them in a dryer.  Use blankets that are made of polyester or cotton.  Clean bathrooms and kitchens with bleach. If possible, have someone repaint the walls in these rooms with mold-resistant paint. Keep out of the rooms that are being cleaned and painted.  Wash hands frequently. SEEK MEDICAL CARE IF:   You have wheezing, shortness of breath, or a cough even if taking medicine to prevent attacks.  The colored mucus you cough up (sputum) is thicker than usual.  Your sputum changes from clear or white to yellow, green, gray, or bloody.  You have any problems that may be related to the medicines you are taking (such as a rash, itching, swelling, or trouble breathing).  You are using a reliever medicine more than 2-3 times per week.  Your peak flow is still at 50-79% of your personal best after following your action plan for 1 hour.  You have a fever. SEEK IMMEDIATE MEDICAL CARE IF:   You seem to be getting worse and are unresponsive to treatment during an asthma attack.  You are short of breath even at rest.  You get short of breath when doing very little physical activity.  You have difficulty eating, drinking, or talking due to asthma symptoms.  You develop chest pain.  You develop a fast heartbeat.  You have a bluish color to your lips or fingernails.  You are light-headed, dizzy, or faint.  Your peak flow is less than 50% of your personal best.   This information is not intended to replace advice given to you by your health care provider. Make sure you discuss any  questions you have with your health care provider.   Document Released: 03/18/2005 Document Revised: 12/07/2014 Document Reviewed: 10/15/2012 Elsevier Interactive Patient Education 2016 [employer]  

## 2015-04-04 NOTE — Progress Notes (Signed)
Medical clearcne dr Ronnald Ramp on chart ekg 01-18-15 epic 02-17-15 exercise tolerance test 02-17-15 epic

## 2015-04-04 NOTE — Patient Instructions (Signed)
Mario Donovan  04/04/2015   Your procedure is scheduled on: 04-14-15  Report to [facility]  Entrance take [facility]  elevators to 3rd floor to  [place] at 645 AM.  Call this number if you have problems the morning of surgery [phone]   Remember: ONLY 1 PERSON MAY GO WITH YOU TO SHORT STAY TO GET  READY MORNING OF [place].  Do not eat food or drink liquids :After Midnight.     Take these medicines the morning of surgery with A SIP OF WATER: Atorvastatin (Lipitor), Trusopt eyedrop,  Symbicort              You may not have any metal on your body including hair pins and              piercings  Do not wear jewelry, make-up, lotions, powders or perfumes, deodorant             Do not wear nail polish.  Do not shave  48 hours prior to surgery.              Men may shave face and neck.   Do not bring valuables to the hospital. [place].  Contacts, dentures or bridgework may not be worn into surgery.  Leave suitcase in the car. After surgery it may be brought to your room.     Patients discharged the day of surgery will not be allowed to drive home.  Name and phone number of your driver:  Special Instructions: N/A              Please read over the following fact sheets you were given: _____________________________________________________________________             [facility] - Preparing for Surgery Before surgery, you can play an important role.  Because skin is not sterile, your skin needs to be as free of germs as possible.  You can reduce the number of germs on your skin by washing with CHG (chlorahexidine gluconate) soap before surgery.  CHG is an antiseptic cleaner which kills germs and bonds with the skin to continue killing germs even after washing. Please DO NOT use if you have an allergy to CHG or antibacterial soaps.  If your skin becomes reddened/irritated stop using the CHG and  inform your nurse when you arrive at Short Stay. Do not shave (including legs and underarms) for at least 48 hours prior to the first CHG shower.  You may shave your face/neck. Please follow these instructions carefully:  1.  Shower with CHG Soap the night before surgery and the  morning of Surgery.  2.  If you choose to wash your hair, wash your hair first as usual with your  normal  shampoo.  3.  After you shampoo, rinse your hair and body thoroughly to remove the  shampoo.                           4.  Use CHG as you would any other liquid soap.  You can apply chg directly  to the skin and wash                       Gently with  a scrungie or clean washcloth.  5.  Apply the CHG Soap to your body ONLY FROM THE NECK DOWN.   Do not use on face/ open                           Wound or open sores. Avoid contact with eyes, ears mouth and genitals (private parts).                       Wash face,  Genitals (private parts) with your normal soap.             6.  Wash thoroughly, paying special attention to the area where your surgery  will be performed.  7.  Thoroughly rinse your body with warm water from the neck down.  8.  DO NOT shower/wash with your normal soap after using and rinsing off  the CHG Soap.                9.  Pat yourself dry with a clean towel.            10.  Wear clean pajamas.            11.  Place clean sheets on your bed the night of your first shower and do not  sleep with pets. Day of Surgery : Do not apply any lotions/deodorants the morning of surgery.  Please wear clean clothes to the hospital/surgery center.  FAILURE TO FOLLOW THESE INSTRUCTIONS MAY RESULT IN THE CANCELLATION OF YOUR SURGERY PATIENT SIGNATURE_________________________________  NURSE SIGNATURE__________________________________  ________________________________________________________________________   Adam Phenix  An incentive spirometer is a tool that can help keep your lungs clear and  active. This tool measures how well you are filling your lungs with each breath. Taking long deep breaths may help reverse or decrease the chance of developing breathing (pulmonary) problems (especially infection) following:  A long period of time when you are unable to move or be active. BEFORE THE PROCEDURE   If the spirometer includes an indicator to show your best effort, your nurse or respiratory therapist will set it to a desired goal.  If possible, sit up straight or lean slightly forward. Try not to slouch.  Hold the incentive spirometer in an upright position. INSTRUCTIONS FOR USE   Sit on the edge of your bed if possible, or sit up as far as you can in bed or on a chair.  Hold the incentive spirometer in an upright position.  Breathe out normally.  Place the mouthpiece in your mouth and seal your lips tightly around it.  Breathe in slowly and as deeply as possible, raising the piston or the ball toward the top of the column.  Hold your breath for 3-5 seconds or for as long as possible. Allow the piston or ball to fall to the bottom of the column.  Remove the mouthpiece from your mouth and breathe out normally.  Rest for a few seconds and repeat Steps 1 through 7 at least 10 times every 1-2 hours when you are awake. Take your time and take a few normal breaths between deep breaths.  The spirometer may include an indicator to show your best effort. Use the indicator as a goal to work toward during each repetition.  After each set of 10 deep breaths, practice coughing to be sure your lungs are clear. If you have an incision (the cut made at the time of surgery), support your  incision when coughing by placing a pillow or rolled up towels firmly against it. Once you are able to get out of bed, walk around indoors and cough well. You may stop using the incentive spirometer when instructed by your caregiver.  RISKS AND COMPLICATIONS  Take your time so you do not get dizzy or  light-headed.  If you are in pain, you may need to take or ask for pain medication before doing incentive spirometry. It is harder to take a deep breath if you are having pain. AFTER USE  Rest and breathe slowly and easily.  It can be helpful to keep track of a log of your progress. Your caregiver can provide you with a simple table to help with this. If you are using the spirometer at home, follow these instructions: [place] IF:   You are having difficultly using the spirometer.  You have trouble using the spirometer as often as instructed.  Your pain medication is not giving enough relief while using the spirometer.  You develop fever of 100.5 F (38.1 C) or higher. SEEK IMMEDIATE MEDICAL CARE IF:   You cough up bloody sputum that had not been present before.  You develop fever of 102 F (38.9 C) or greater.  You develop worsening pain at or near the incision site. MAKE SURE YOU:   Understand these instructions.  Will watch your condition.  Will get help right away if you are not doing well or get worse. Document Released: 07/29/2006 Document Revised: 06/10/2011 Document Reviewed: 09/29/2006 ExitCare Patient Information 2014 ExitCare, [place].   ________________________________________________________________________  WHAT IS A BLOOD TRANSFUSION? Blood Transfusion Information  A transfusion is the replacement of blood or some of its parts. Blood is made up of multiple cells which provide different functions.  Red blood cells carry oxygen and are used for blood loss replacement.  White blood cells fight against infection.  Platelets control bleeding.  Plasma helps clot blood.  Other blood products are available for specialized needs, such as hemophilia or other clotting disorders. BEFORE THE TRANSFUSION  Who gives blood for transfusions?   Healthy volunteers who are fully evaluated to make sure their blood is safe. This is blood bank  blood. Transfusion therapy is the safest it has ever been in the practice of medicine. Before blood is taken from a donor, a complete history is taken to make sure that person has no history of diseases nor engages in risky social behavior (examples are intravenous drug use or sexual activity with multiple partners). The donor's travel history is screened to minimize risk of transmitting infections, such as malaria. The donated blood is tested for signs of infectious diseases, such as HIV and hepatitis. The blood is then tested to be sure it is compatible with you in order to minimize the chance of a transfusion reaction. If you or a relative donates blood, this is often done in anticipation of surgery and is not appropriate for emergency situations. It takes many days to process the donated blood. RISKS AND COMPLICATIONS Although transfusion therapy is very safe and saves many lives, the main dangers of transfusion include:   Getting an infectious disease.  Developing a transfusion reaction. This is an allergic reaction to something in the blood you were given. Every precaution is taken to prevent this. The decision to have a blood transfusion has been considered carefully by your caregiver before blood is given. Blood is not given unless the benefits outweigh the risks. AFTER THE TRANSFUSION  Right  after receiving a blood transfusion, you will usually feel much better and more energetic. This is especially true if your red blood cells have gotten low (anemic). The transfusion raises the level of the red blood cells which carry oxygen, and this usually causes an energy increase.  The nurse administering the transfusion will monitor you carefully for complications. HOME CARE INSTRUCTIONS  No special instructions are needed after a transfusion. You may find your energy is better. Speak with your caregiver about any limitations on activity for underlying diseases you may have. SEEK MEDICAL CARE IF:    Your condition is not improving after your transfusion.  You develop redness or irritation at the intravenous (IV) site. SEEK IMMEDIATE MEDICAL CARE IF:  Any of the following symptoms occur over the next 12 hours:  Shaking chills.  You have a temperature by mouth above 102 F (38.9 C), not controlled by medicine.  Chest, back, or muscle pain.  People around you feel you are not acting correctly or are confused.  Shortness of breath or difficulty breathing.  Dizziness and fainting.  You get a rash or develop hives.  You have a decrease in urine output.  Your urine turns a dark color or changes to pink, red, or brown. Any of the following symptoms occur over the next 10 days:  You have a temperature by mouth above 102 F (38.9 C), not controlled by medicine.  Shortness of breath.  Weakness after normal activity.  The white part of the eye turns yellow (jaundice).  You have a decrease in the amount of urine or are urinating less often.  Your urine turns a dark color or changes to pink, red, or brown. Document Released: 03/15/2000 Document Revised: 06/10/2011 Document Reviewed: 11/02/2007 [facility] Patient Information 2014 [place], [place].  _______________________________________________________________________

## 2015-04-05 ENCOUNTER — Encounter (HOSPITAL_COMMUNITY): Payer: Self-pay

## 2015-04-05 ENCOUNTER — Encounter (HOSPITAL_COMMUNITY)
Admission: RE | Admit: 2015-04-05 | Discharge: 2015-04-05 | Disposition: A | Discharge status: Home / Self Care | Payer: Medicare Other | Source: Ambulatory Visit | Attending: Urology | Admitting: Urology

## 2015-04-05 DIAGNOSIS — Z01818 Encounter for other preprocedural examination: Secondary | ICD-10-CM | POA: Diagnosis present

## 2015-04-05 DIAGNOSIS — N400 Enlarged prostate without lower urinary tract symptoms: Secondary | ICD-10-CM | POA: Diagnosis not present

## 2015-04-05 HISTORY — DX: Pure hypercholesterolemia, unspecified: E78.00

## 2015-04-05 HISTORY — DX: Unspecified asthma, uncomplicated: J45.909

## 2015-04-05 HISTORY — DX: Benign prostatic hyperplasia without lower urinary tract symptoms: N40.0

## 2015-04-05 HISTORY — DX: Unspecified glaucoma: H40.9

## 2015-04-05 LAB — BASIC METABOLIC PANEL
ANION GAP: 12 (ref 5–15)
BUN: 24 mg/dL — ABNORMAL HIGH (ref 6–20)
CHLORIDE: 107 mmol/L (ref 101–111)
CO2: 23 mmol/L (ref 22–32)
Calcium: 9.3 mg/dL (ref 8.9–10.3)
Creatinine, Ser: 0.93 mg/dL (ref 0.61–1.24)
GFR calc Af Amer: >60 mL/min (ref >60)
GFR calc non Af Amer: >60 mL/min (ref >60)
GLUCOSE: 112 mg/dL — AB (ref 65–99)
POTASSIUM: 4.2 mmol/L (ref 3.5–5.1)
SODIUM: 142 mmol/L (ref 135–145)

## 2015-04-05 LAB — CBC
HCT: 46.8 % (ref 39.0–52.0)
Hemoglobin: 15.7 g/dL (ref 13.0–17.0)
MCH: 30 pg (ref 26.0–34.0)
MCHC: 33.5 g/dL (ref 30.0–36.0)
MCV: 89.3 fL (ref 78.0–100.0)
PLATELETS: 240 10*3/uL (ref 150–400)
RBC: 5.24 MIL/uL (ref 4.22–5.81)
RDW: 13.1 % (ref 11.5–15.5)
WBC: 6.7 10*3/uL (ref 4.0–10.5)

## 2015-04-05 LAB — ABO/RH: ABO/RH(D): A POS

## 2015-04-05 NOTE — Progress Notes (Signed)
bmet results faxed by epic to dr Gaynelle Arabian

## 2015-04-14 ENCOUNTER — Observation Stay (HOSPITAL_COMMUNITY)
Admission: RE | Admit: 2015-04-14 | Discharge: 2015-04-15 | Disposition: A | Discharge status: Home / Self Care | Payer: Medicare Other | Source: Ambulatory Visit | Attending: Urology | Admitting: Urology

## 2015-04-14 ENCOUNTER — Encounter (HOSPITAL_COMMUNITY)
Admission: RE | Disposition: A | Discharge status: Home / Self Care | Payer: Self-pay | Source: Ambulatory Visit | Attending: Urology

## 2015-04-14 ENCOUNTER — Inpatient Hospital Stay (HOSPITAL_COMMUNITY): Payer: Medicare Other | Admitting: Anesthesiology

## 2015-04-14 ENCOUNTER — Encounter (HOSPITAL_COMMUNITY): Payer: Self-pay | Admitting: *Deleted

## 2015-04-14 DIAGNOSIS — N401 Enlarged prostate with lower urinary tract symptoms: Secondary | ICD-10-CM | POA: Diagnosis not present

## 2015-04-14 DIAGNOSIS — Z79899 Other long term (current) drug therapy: Secondary | ICD-10-CM | POA: Diagnosis not present

## 2015-04-14 DIAGNOSIS — R351 Nocturia: Secondary | ICD-10-CM | POA: Insufficient documentation

## 2015-04-14 DIAGNOSIS — Z7982 Long term (current) use of aspirin: Secondary | ICD-10-CM | POA: Diagnosis not present

## 2015-04-14 DIAGNOSIS — H409 Unspecified glaucoma: Secondary | ICD-10-CM | POA: Diagnosis not present

## 2015-04-14 DIAGNOSIS — C61 Malignant neoplasm of prostate: Secondary | ICD-10-CM | POA: Diagnosis present

## 2015-04-14 DIAGNOSIS — N138 Other obstructive and reflux uropathy: Secondary | ICD-10-CM | POA: Diagnosis not present

## 2015-04-14 DIAGNOSIS — R32 Unspecified urinary incontinence: Secondary | ICD-10-CM | POA: Diagnosis not present

## 2015-04-14 DIAGNOSIS — N400 Enlarged prostate without lower urinary tract symptoms: Secondary | ICD-10-CM

## 2015-04-14 DIAGNOSIS — E7800 Pure hypercholesterolemia, unspecified: Secondary | ICD-10-CM | POA: Insufficient documentation

## 2015-04-14 DIAGNOSIS — R3915 Urgency of urination: Secondary | ICD-10-CM | POA: Insufficient documentation

## 2015-04-14 DIAGNOSIS — R972 Elevated prostate specific antigen [PSA]: Secondary | ICD-10-CM | POA: Diagnosis not present

## 2015-04-14 DIAGNOSIS — J45909 Unspecified asthma, uncomplicated: Secondary | ICD-10-CM | POA: Diagnosis not present

## 2015-04-14 DIAGNOSIS — R350 Frequency of micturition: Secondary | ICD-10-CM | POA: Insufficient documentation

## 2015-04-14 HISTORY — PX: PROSTATECTOMY: SHX69

## 2015-04-14 LAB — TYPE AND SCREEN
ABO/RH(D): A POS
Antibody Screen: NEGATIVE

## 2015-04-14 LAB — HEMOGLOBIN AND HEMATOCRIT, BLOOD
HEMATOCRIT: 42 % (ref 39.0–52.0)
HEMOGLOBIN: 14.1 g/dL (ref 13.0–17.0)

## 2015-04-14 SURGERY — PROSTATECTOMY, RETROPUBIC
Anesthesia: General

## 2015-04-14 MED ORDER — ONDANSETRON HCL 4 MG/2ML IJ SOLN
INTRAMUSCULAR | Status: DC | PRN
  Administered 2015-04-14 (×2): 4 mg via INTRAVENOUS

## 2015-04-14 MED ORDER — PROMETHAZINE HCL 25 MG/ML IJ SOLN
6.2500 mg | INTRAMUSCULAR | Status: DC | PRN
  Administered 2015-04-14: 6.25 mg via INTRAVENOUS

## 2015-04-14 MED ORDER — SENNOSIDES-DOCUSATE SODIUM 8.6-50 MG PO TABS
2.0000 | ORAL_TABLET | Freq: Two times a day (BID) | ORAL | Status: DC
  Administered 2015-04-14 – 2015-04-15 (×2): 2 via ORAL
  Filled 2015-04-14 (×2): qty 2

## 2015-04-14 MED ORDER — KCL IN DEXTROSE-NACL 20-5-0.45 MEQ/L-%-% IV SOLN
INTRAVENOUS | Status: DC
  Administered 2015-04-14: 125 mL/h via INTRAVENOUS
  Administered 2015-04-15: 01:00:00 via INTRAVENOUS
  Filled 2015-04-14 (×4): qty 1000

## 2015-04-14 MED ORDER — 0.9 % SODIUM CHLORIDE (POUR BTL) OPTIME
TOPICAL | Status: DC | PRN
  Administered 2015-04-14: 3000 mL

## 2015-04-14 MED ORDER — FENTANYL CITRATE (PF) 100 MCG/2ML IJ SOLN
INTRAMUSCULAR | Status: DC | PRN
  Administered 2015-04-14: 50 ug via INTRAVENOUS

## 2015-04-14 MED ORDER — ALBUTEROL SULFATE HFA 108 (90 BASE) MCG/ACT IN AERS
INHALATION_SPRAY | RESPIRATORY_TRACT | Status: DC | PRN
Start: 2015-04-14 — End: 2015-04-14
  Administered 2015-04-14 (×2): 2 via RESPIRATORY_TRACT

## 2015-04-14 MED ORDER — HYDROMORPHONE HCL 1 MG/ML IJ SOLN
INTRAMUSCULAR | Status: AC
  Filled 2015-04-14: qty 1

## 2015-04-14 MED ORDER — LIDOCAINE HCL (CARDIAC) 20 MG/ML IV SOLN
INTRAVENOUS | Status: AC
  Filled 2015-04-14: qty 5

## 2015-04-14 MED ORDER — ROCURONIUM BROMIDE 100 MG/10ML IV SOLN
INTRAVENOUS | Status: AC
  Filled 2015-04-14: qty 1

## 2015-04-14 MED ORDER — BUDESONIDE-FORMOTEROL FUMARATE 160-4.5 MCG/ACT IN AERO
2.0000 | INHALATION_SPRAY | Freq: Two times a day (BID) | RESPIRATORY_TRACT | Status: DC
  Administered 2015-04-14: 2 via RESPIRATORY_TRACT
  Filled 2015-04-14: qty 6

## 2015-04-14 MED ORDER — LACTATED RINGERS IV SOLN
INTRAVENOUS | Status: DC | PRN
  Administered 2015-04-14 (×2): via INTRAVENOUS

## 2015-04-14 MED ORDER — DIPHENHYDRAMINE HCL 50 MG/ML IJ SOLN: 12.5000 mg | Freq: Four times a day (QID) | INTRAMUSCULAR | Status: DC | PRN

## 2015-04-14 MED ORDER — PROPOFOL 10 MG/ML IV BOLUS
INTRAVENOUS | Status: AC
  Filled 2015-04-14: qty 20

## 2015-04-14 MED ORDER — OXYCODONE-ACETAMINOPHEN 5-325 MG PO TABS: 1.0000 | ORAL_TABLET | ORAL | Status: DC | PRN

## 2015-04-14 MED ORDER — SENNOSIDES-DOCUSATE SODIUM 8.6-50 MG PO TABS: 3.0000 | ORAL_TABLET | Freq: Two times a day (BID) | ORAL | Status: DC

## 2015-04-14 MED ORDER — ACETAMINOPHEN 500 MG PO TABS
ORAL_TABLET | ORAL | Status: AC
  Filled 2015-04-14: qty 2

## 2015-04-14 MED ORDER — OXYCODONE HCL 5 MG PO TABS: 10.0000 mg | ORAL_TABLET | ORAL | Status: DC | PRN

## 2015-04-14 MED ORDER — HYDROMORPHONE HCL 1 MG/ML IJ SOLN
INTRAMUSCULAR | Status: DC | PRN
  Administered 2015-04-14: .4 mg via INTRAVENOUS
  Administered 2015-04-14: .6 mg via INTRAVENOUS
  Administered 2015-04-14: .4 mg via INTRAVENOUS

## 2015-04-14 MED ORDER — SODIUM CHLORIDE 0.9 % IJ SOLN
INTRAMUSCULAR | Status: AC
  Filled 2015-04-14: qty 20

## 2015-04-14 MED ORDER — KETOROLAC TROMETHAMINE 30 MG/ML IJ SOLN
INTRAMUSCULAR | Status: AC
  Filled 2015-04-14: qty 1

## 2015-04-14 MED ORDER — SODIUM CHLORIDE 0.9 % IV BOLUS (SEPSIS)
1000.0000 mL | Freq: Once | INTRAVENOUS | Status: AC
  Administered 2015-04-14: 1000 mL via INTRAVENOUS

## 2015-04-14 MED ORDER — SULFAMETHOXAZOLE-TRIMETHOPRIM 800-160 MG PO TABS: 1.0000 | ORAL_TABLET | Freq: Two times a day (BID) | ORAL | Status: AC

## 2015-04-14 MED ORDER — BUPIVACAINE LIPOSOME 1.3 % IJ SUSP
20.0000 mL | Freq: Once | INTRAMUSCULAR | Status: AC
  Administered 2015-04-14: 20 mL
  Filled 2015-04-14: qty 20

## 2015-04-14 MED ORDER — SUGAMMADEX SODIUM 500 MG/5ML IV SOLN
INTRAVENOUS | Status: DC | PRN
  Administered 2015-04-14: 250 mg via INTRAVENOUS

## 2015-04-14 MED ORDER — OXYCODONE HCL 5 MG PO TABS: 5.0000 mg | ORAL_TABLET | Freq: Once | ORAL | Status: DC | PRN

## 2015-04-14 MED ORDER — OXYBUTYNIN CHLORIDE 5 MG PO TABS: 5.0000 mg | ORAL_TABLET | Freq: Three times a day (TID) | ORAL | Status: DC | PRN

## 2015-04-14 MED ORDER — ATORVASTATIN CALCIUM 20 MG PO TABS
20.0000 mg | ORAL_TABLET | Freq: Every day | ORAL | Status: DC
  Filled 2015-04-14: qty 1

## 2015-04-14 MED ORDER — SENNA 8.6 MG PO TABS: 1.0000 | ORAL_TABLET | Freq: Every day | ORAL | Status: AC

## 2015-04-14 MED ORDER — OXYCODONE HCL 5 MG/5ML PO SOLN
5.0000 mg | Freq: Once | ORAL | Status: DC | PRN
  Filled 2015-04-14: qty 5

## 2015-04-14 MED ORDER — DEXAMETHASONE SODIUM PHOSPHATE 10 MG/ML IJ SOLN
INTRAMUSCULAR | Status: DC | PRN
  Administered 2015-04-14: 5 mg via INTRAVENOUS

## 2015-04-14 MED ORDER — ONDANSETRON HCL 4 MG/2ML IJ SOLN: 4.0000 mg | INTRAMUSCULAR | Status: DC | PRN

## 2015-04-14 MED ORDER — ROCURONIUM BROMIDE 100 MG/10ML IV SOLN
INTRAVENOUS | Status: DC | PRN
  Administered 2015-04-14: 20 mg via INTRAVENOUS
  Administered 2015-04-14: 60 mg via INTRAVENOUS
  Administered 2015-04-14: 30 mg via INTRAVENOUS
  Administered 2015-04-14: 20 mg via INTRAVENOUS

## 2015-04-14 MED ORDER — ACETAMINOPHEN 325 MG PO TABS
975.0000 mg | ORAL_TABLET | Freq: Four times a day (QID) | ORAL | Status: DC
  Administered 2015-04-14 – 2015-04-15 (×4): 975 mg via ORAL
  Filled 2015-04-14 (×5): qty 3

## 2015-04-14 MED ORDER — ACETAMINOPHEN 500 MG PO TABS
1000.0000 mg | ORAL_TABLET | Freq: Once | ORAL | Status: AC
  Administered 2015-04-14: 1000 mg via ORAL

## 2015-04-14 MED ORDER — MORPHINE SULFATE (PF) 2 MG/ML IV SOLN: 2.0000 mg | INTRAVENOUS | Status: DC | PRN

## 2015-04-14 MED ORDER — FENTANYL CITRATE (PF) 100 MCG/2ML IJ SOLN
INTRAMUSCULAR | Status: AC
  Filled 2015-04-14: qty 2

## 2015-04-14 MED ORDER — CEFAZOLIN SODIUM 1-5 GM-% IV SOLN
1.0000 g | Freq: Three times a day (TID) | INTRAVENOUS | Status: AC
  Administered 2015-04-14 – 2015-04-15 (×2): 1 g via INTRAVENOUS
  Filled 2015-04-14 (×2): qty 50

## 2015-04-14 MED ORDER — SUGAMMADEX SODIUM 500 MG/5ML IV SOLN
INTRAVENOUS | Status: AC
  Filled 2015-04-14: qty 5

## 2015-04-14 MED ORDER — KETOROLAC TROMETHAMINE 15 MG/ML IJ SOLN
15.0000 mg | Freq: Four times a day (QID) | INTRAMUSCULAR | Status: DC
  Administered 2015-04-14 – 2015-04-15 (×4): 15 mg via INTRAVENOUS
  Filled 2015-04-14 (×5): qty 1

## 2015-04-14 MED ORDER — PROPOFOL 10 MG/ML IV BOLUS
INTRAVENOUS | Status: DC | PRN
  Administered 2015-04-14: 200 mg via INTRAVENOUS

## 2015-04-14 MED ORDER — DEXAMETHASONE SODIUM PHOSPHATE 10 MG/ML IJ SOLN
INTRAMUSCULAR | Status: AC
  Filled 2015-04-14: qty 1

## 2015-04-14 MED ORDER — LATANOPROST 0.005 % OP SOLN
1.0000 [drp] | Freq: Every day | OPHTHALMIC | Status: DC
  Administered 2015-04-14: 1 [drp] via OPHTHALMIC
  Filled 2015-04-14: qty 2.5

## 2015-04-14 MED ORDER — PROMETHAZINE HCL 25 MG/ML IJ SOLN
INTRAMUSCULAR | Status: AC
  Filled 2015-04-14: qty 1

## 2015-04-14 MED ORDER — ALBUTEROL SULFATE HFA 108 (90 BASE) MCG/ACT IN AERS
INHALATION_SPRAY | RESPIRATORY_TRACT | Status: AC
  Filled 2015-04-14: qty 6.7

## 2015-04-14 MED ORDER — HYDROMORPHONE HCL 1 MG/ML IJ SOLN
0.2500 mg | INTRAMUSCULAR | Status: DC | PRN
  Administered 2015-04-14: 0.5 mg via INTRAVENOUS

## 2015-04-14 MED ORDER — ONDANSETRON HCL 4 MG/2ML IJ SOLN
INTRAMUSCULAR | Status: AC
  Filled 2015-04-14: qty 4

## 2015-04-14 MED ORDER — DORZOLAMIDE HCL 2 % OP SOLN
1.0000 [drp] | Freq: Three times a day (TID) | OPHTHALMIC | Status: DC
  Administered 2015-04-14 – 2015-04-15 (×2): 1 [drp] via OPHTHALMIC
  Filled 2015-04-14: qty 10

## 2015-04-14 MED ORDER — HYDROMORPHONE HCL 2 MG/ML IJ SOLN
INTRAMUSCULAR | Status: AC
  Filled 2015-04-14: qty 2

## 2015-04-14 MED ORDER — LIDOCAINE HCL (CARDIAC) 20 MG/ML IV SOLN
INTRAVENOUS | Status: DC | PRN
  Administered 2015-04-14: 100 mg via INTRAVENOUS

## 2015-04-14 MED ORDER — KETOROLAC TROMETHAMINE 30 MG/ML IJ SOLN
INTRAMUSCULAR | Status: DC | PRN
  Administered 2015-04-14: 30 mg via INTRAVENOUS

## 2015-04-14 MED ORDER — ONDANSETRON HCL 4 MG/2ML IJ SOLN: 4.0000 mg | Freq: Once | INTRAMUSCULAR | Status: DC | PRN

## 2015-04-14 MED ORDER — CEFAZOLIN SODIUM-DEXTROSE 2-3 GM-% IV SOLR
2.0000 g | INTRAVENOUS | Status: AC
  Administered 2015-04-14: 2 g via INTRAVENOUS

## 2015-04-14 MED ORDER — DIPHENHYDRAMINE HCL 12.5 MG/5ML PO ELIX: 12.5000 mg | ORAL_SOLUTION | Freq: Four times a day (QID) | ORAL | Status: DC | PRN

## 2015-04-14 SURGICAL SUPPLY — 57 items
BAG URINE DRAINAGE (UROLOGICAL SUPPLIES) ×3 IMPLANT
BLADE EXTENDED COATED 6.5IN (ELECTRODE) ×3 IMPLANT
BLADE HEX COATED 2.75 (ELECTRODE) ×3 IMPLANT
BLADE SURG 15 STRL LF DISP TIS (BLADE) ×1 IMPLANT
BLADE SURG 15 STRL SS (BLADE) ×2
CATH FOLEY 2WAY SLVR 30CC 22FR (CATHETERS) ×3 IMPLANT
CATH SILASTIC FOLEY 20FRX30CC (CATHETERS) ×3 IMPLANT
CLIP LIGATING HEM O LOK PURPLE (MISCELLANEOUS) IMPLANT
CLIP LIGATING HEMO O LOK GREEN (MISCELLANEOUS) IMPLANT
COVER SURGICAL LIGHT HANDLE (MISCELLANEOUS) ×3 IMPLANT
DISSECTOR ROUND CHERRY 3/8 STR (MISCELLANEOUS) ×3 IMPLANT
DRAIN CHANNEL 10F 3/8 F FF (DRAIN) ×3 IMPLANT
DRAPE LAPAROTOMY T 102X78X121 (DRAPES) ×3 IMPLANT
DRAPE TABLE BACK 44X90 PK DISP (DRAPES) ×3 IMPLANT
DRAPE UTILITY 15X26 (DRAPE) ×3 IMPLANT
DRAPE WARM FLUID 44X44 (DRAPE) ×3 IMPLANT
DRSG OPSITE POSTOP 4X10 (GAUZE/BANDAGES/DRESSINGS) ×3 IMPLANT
DRSG TEGADERM 4X4.75 (GAUZE/BANDAGES/DRESSINGS) ×3 IMPLANT
ELECT REM PT RETURN 9FT ADLT (ELECTROSURGICAL) ×3
ELECTRODE REM PT RTRN 9FT ADLT (ELECTROSURGICAL) ×1 IMPLANT
EVACUATOR SILICONE 100CC (DRAIN) ×3 IMPLANT
GAUZE SPONGE 2X2 8PLY STRL LF (GAUZE/BANDAGES/DRESSINGS) ×1 IMPLANT
GAUZE SPONGE 4X4 12PLY STRL (GAUZE/BANDAGES/DRESSINGS) ×3 IMPLANT
GAUZE SPONGE 4X4 16PLY XRAY LF (GAUZE/BANDAGES/DRESSINGS) ×3 IMPLANT
GLOVE BIOGEL M STRL SZ7.5 (GLOVE) ×3 IMPLANT
GOWN STRL REUS W/TWL XL LVL3 (GOWN DISPOSABLE) ×3 IMPLANT
HOLDER FOLEY CATH W/STRAP (MISCELLANEOUS) ×3 IMPLANT
KIT BASIN OR (CUSTOM PROCEDURE TRAY) ×3 IMPLANT
NEEDLE HYPO 22GX1.5 SAFETY (NEEDLE) ×3 IMPLANT
NS IRRIG 1000ML POUR BTL (IV SOLUTION) IMPLANT
PACK GENERAL/GYN (CUSTOM PROCEDURE TRAY) ×3 IMPLANT
PLUG CATH AND CAP STER (CATHETERS) ×3 IMPLANT
SCRUB PCMX 4 OZ (MISCELLANEOUS) IMPLANT
SPONGE GAUZE 2X2 STER 10/PKG (GAUZE/BANDAGES/DRESSINGS) ×2
SPONGE LAP 18X18 X RAY DECT (DISPOSABLE) ×6 IMPLANT
SPONGE LAP 4X18 X RAY DECT (DISPOSABLE) ×3 IMPLANT
STAPLER VISISTAT 35W (STAPLE) ×3 IMPLANT
SURGIFLO W/THROMBIN 8M KIT (HEMOSTASIS) ×3 IMPLANT
SURGILUBE 3G PEEL PACK STRL (MISCELLANEOUS) IMPLANT
SUT ETHILON 3 0 PS 1 (SUTURE) ×3 IMPLANT
SUT MNCRL AB 4-0 PS2 18 (SUTURE) ×3 IMPLANT
SUT PDS AB 1 CTX 36 (SUTURE) ×6 IMPLANT
SUT SILK 0 (SUTURE) ×2
SUT SILK 0 30XBRD TIE 6 (SUTURE) ×1 IMPLANT
SUT VIC AB 0 CT1 27 (SUTURE) ×2
SUT VIC AB 0 CT1 27XBRD ANTBC (SUTURE) ×1 IMPLANT
SUT VIC AB 0 UR5 27 (SUTURE) ×6 IMPLANT
SUT VIC AB 2-0 UR5 27 (SUTURE) ×15 IMPLANT
SUT VIC AB 4-0 RB1 27 (SUTURE) ×6
SUT VIC AB 4-0 RB1 27XBRD (SUTURE) ×3 IMPLANT
SYR 30ML LL (SYRINGE) ×3 IMPLANT
SYRINGE 20CC LL (MISCELLANEOUS) ×3 IMPLANT
TAPE CLOTH SURG 4X10 WHT LF (GAUZE/BANDAGES/DRESSINGS) ×3 IMPLANT
TAPE UMBILICAL COTTON 1/8X30 (MISCELLANEOUS) ×3 IMPLANT
TOWEL OR 17X26 10 PK STRL BLUE (TOWEL DISPOSABLE) ×6 IMPLANT
TOWEL OR NON WOVEN STRL DISP B (DISPOSABLE) ×3 IMPLANT
WATER STERILE IRR 1500ML POUR (IV SOLUTION) IMPLANT

## 2015-04-14 NOTE — Op Note (Signed)
Post-operative Diagnosis: BPH with obstruction  Procedure and Anesthesia:  Procedure(s) and Anesthesia Type:    * PROSTATECTOMY SIMPLE RETROPUBIC - Choice  Surgeon: Surgeon(s) and Role:    * Carolan Clines, MD - Primary   Resident:  Star Age  EBL: 150 mL  IVF: See anesthesia record  UOP: See anesthesia record  Drains: 20 Fr foley catheter (2-way), 19 Fr blake drain  Specimens: prostate adenoma  Complications: * No complications entered in OR log *  Indications for Surgery: 70 y.o. male with BPH, significant LUTS and 120 gram galnd. He presents today for retropubic simple prostatectomy. Risks, benefits, and alternatives of the above procedure were discussed previously in detail and informed consents was signed and verified.  Findings:  -Large adenoma successfully removed -Bladder neck in tact -Excellent hemostasis achieved with Surgiflo and capsular closure  Procedure Details: The patient and consent was verified in the pre-op holding area and brought to the operating room where they were placed on the operating table. Pre-induction time out was called and general anesthesia was induced. SCDs were placed and IV antibiotics were started. The patient was positioned with an axillary roll behind his hips and the table was flexed. He was placed in Trendelenberg's position to adequately expose the pelvis. He was clipped, prepped and draped in the usual sterile fashion using betadine.  A 20 Fr foley catheter was placed on the sterile field in standard fashion. After a pre-incision time-out was performed we made a roughly 5 cm low midline incision from pubis up cephalad a hands breadth below the umbilicus in the midline. We carefully dissected through the subcutaneous tissues using electrocautery. We identified the linea alba in the midline and came through exposing the belly of the rectus muscle which was split in a muscle sparing fashion. The anterior and lateral attachments of the  bladder were taken down using a combination of blunt dissection, sharp dissection and electrocautery. Once we identified the bladder neck and prostate we placed our Omni retractor and pulled the bladder cephalad which clearly exposed the bladder neck and the prostate. The foley balloon was palpated as were the borders of the prostate.  2-0 Vicryl stay sutures were placed in the prostate capsule bilaterally and a vertical incision was made through the capsule of the prostate using a #15 blade scalpel. We identified the ideal plan between the adenoma and prostate capsule and proceeded to finger fracture the adenoma taking care not to disrupt the bladder neck. The adenoma was freed from all capsular attachments and was removed in separate lobes. The RIGHT lobe was tagged with a sutures indicating short stitch superior, long stitch lateral. The prostate capsule and resection bed were then copiously irrigated with normal saline and hemostasis was noted to be adequate. We then injected Surgiflo into the resection bed and closed the capsule using 2-0 Vicryl running horizontal mattress sutures in addition to closing our pre-placed stay sutures. Hemostasis was noted to be excellent. 30 cc was instilled in the foley balloon and the catheter was irrigated and no clots were noted. The closure appeared to be water tight. A 19 Fr Blake drain was placed in the pelvis and brought out lateral to the rectus muscles in the left lower quadrant.  We then turned our attention to closing. We loosely re-approximated the rectus muscls with #2-0 Vicryl suture. The rectus fascia was then closed with two separate running 0-PDS sutures in standard fashion. The subdermal tissue was re-approximated using 2-0 Vicryl and the skin was closed with  4-0 Monocryl in running fashion. Exparel was injected in the deep tissue layers prior to closing. Waffle dressing was applied. At this point the procedure was complete, anesthesia was reversed and the  patient awoke having tolerated the procedure well. He was taken to the PACU in stable condition.  Post-operative plan: -The patient will be admitted for ongoing post-surgical care -He will be discharged home with foley catheter in place x 10 days and will follow-up in the office with Dr. Gaynelle Arabian with a cystogram at that time -He will be discharged on 5 days of Bactrim  Teaching Physician Attestation: Dr. Gaynelle Arabian was present and scrubbed for the entirety of the procedure  Pietro Cassis. Ottis Stain, MD Resident [facility] Department of Urologic Surgery/[facility]  Urology Attending Note: I was present for , and participated in , all aspects of this patient's surgical care.

## 2015-04-14 NOTE — H&P (Signed)
Reason For Visit Cystoscopy, flowrate, PVR & PUS   Active Problems Problems  1. Benign prostatic hyperplasia with urinary obstruction (N40.1,N13.8) 2. Elevated prostate specific antigen (PSA) (R97.20) 3. Nocturia (R35.1) 4. Urinary frequency (R35.0) 5. Urinary incontinence (R32) 6. 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 He had a negative prostate biopsy on 07/16/11. Hx of BPH and elevated PSA and taking double -dose Flomax. Has a history of a normal PSA-2, with negative biopsy in October 2006. He has previously failed Detrol LA for urinary urgency/frequency. No real change.     He still drinks Dr. Malachi Bonds, and has decreased from 3/day to 2/week. His IPSS = 9 last year, on Tamsulosin, 2 HS. No hx of diabetes.            PSA 4.63 per Dr. Jenny Reichmann. PSA 5.11 in March, 2011.    01/25/15 PSA - 5.18/23%  01/07/14 PSA - 3.84  12/30/12 PSA - 4.98/22%  05/29/11 PSA - 6.28  05/27/11 PSA - 5.86  04/11/10 PSA - 3.37.   Surgical History Problems  1. History of Tonsillectomy  Current Meds 1. Aspirin 81 MG TABS;  Therapy: (Recorded:16Jan2008) to Recorded 2. Atorvastatin Calcium 20 MG Oral Tablet;  Therapy: (Recorded:02Nov2016) to Recorded 3. Multi-Vitamin TABS;  Therapy: (Recorded:16Jan2008) to Recorded 4. Symbicort 160-4.5 MCG/ACT Inhalation Aerosol;  Therapy: (Recorded:02Nov2016) to Recorded 5. Tamsulosin HCl - 0.4 MG Oral Capsule; TAKE 1 CAPSULE Bedtime;  Therapy: 28Oct2016 to (Last Rx:28Oct2016)  Requested for: 28Nov2016; Status: ACTIVE  - Renewal Denied Ordered 6. Tamsulosin HCl - 0.4 MG Oral Capsule; TAKE 2 CAPSULE Daily;  Therapy: [id number] to (Evaluate:15Oct2016)  Requested for: 21Oct2015; Last  Rx:21Oct2015 Ordered  Allergies Medication  1. No Known Drug Allergies  Family History Problems  1. Family history of Hypertension :  Father  Social History Problems  1. Caffeine Use   3 2. Family history of Death In The Family Father   61, heart attack 3. Family history of Death In The Family Mother   1, stomach cancer 74. Marital History - Currently Married 5. Never A Smoker 6. Occupation:   Quarry manager  Review of Systems  Genitourinary: urinary frequency, feelings of urinary urgency, nocturia, weak urinary stream, urinary stream starts and stops and incomplete emptying of bladder, but initiating urination does not require straining.  Gastrointestinal: no nausea, no vomiting, no heartburn, no diarrhea and no constipation.  Constitutional: no fever, no night sweats, not feeling tired (fatigue) and no recent weight loss.  Integumentary: no new skin rashes or lesions and no pruritus.  ENT: sinus problems, but no sore throat.  Cardiovascular: no chest pain and no leg swelling.  Respiratory: no shortness of breath and no cough.  Endocrine: no polydipsia.  Musculoskeletal: joint pain, but no back pain.  Neurological: no headache and no dizziness.  Psychiatric: no anxiety and no depression.    Vitals Vital Signs [Data Includes: Last 1 Day]  Recorded: [id number] 11:46AM  Blood Pressure: 144 / 85 Temperature: 97.6 F Heart Rate: 75  Physical Exam Constitutional: Well nourished.  ENT:. The ears and nose are normal in appearance.  Neck: The appearance of the neck is normal and no neck mass is present.  Pulmonary: No respiratory distress and normal respiratory rhythm and effort.  Cardiovascular:. No peripheral edema.  Abdomen: The abdomen is obese. The abdomen is soft and nontender. No masses are palpated.  No CVA tenderness. No hernias are palpable. No hepatosplenomegaly noted.  Rectal: Rectal exam demonstrates normal sphincter tone, no tenderness and no masses. Estimated prostate size is 3+. Normal rectal tone, no rectal masses, prostate is smooth, symmetric and non-tender. The prostate has no nodularity and  is not tender. The left seminal vesicle is nonpalpable. The right seminal vesicle is nonpalpable. The perineum is normal on inspection.  Genitourinary: Examination of the penis demonstrates no discharge, no masses, no lesions and a normal meatus. The penis is circumcised. The scrotum is without lesions. The right epididymis is palpably normal and non-tender. The left epididymis is palpably normal and non-tender. The right testis is non-tender and without masses. The left testis is non-tender and without masses.  Lymphatics: The femoral and inguinal nodes are not enlarged or tender.  Skin: Normal skin turgor, no visible rash and no visible skin lesions.  Neuro/Psych:. Mood and affect are appropriate.    Results/Data Urine [Data Includes: Last 1 Day]   [id number]  COLOR YELLOW   APPEARANCE CLEAR   SPECIFIC GRAVITY 1.025   pH 6.0   GLUCOSE NEGATIVE   BILIRUBIN NEGATIVE   KETONE NEGATIVE   BLOOD TRACE   PROTEIN NEGATIVE   NITRITE NEGATIVE   LEUKOCYTE ESTERASE NEGATIVE   SQUAMOUS EPITHELIAL/HPF 0-5 HPF  WBC NONE SEEN WBC/HPF  RBC 0-2 RBC/HPF  BACTERIA NONE SEEN HPF  CRYSTALS NONE SEEN HPF  CASTS NONE SEEN LPF  Yeast NONE SEEN HPF   Flow Rate: Voided 290 ml. A peak flow rate of 37ml/s and mean flow rate of 66ml/s.  PVR: Ultrasound PVR 19.50 ml.    Procedure Prostate u/s today: Length - 5.89cm, Height - 5.67cm and Width - 6.88cm. Total volume - 120.47 grams. Prostate calcifications and subcentimeter cysts seen in prostate.   Procedure: Cystoscopy  Chaperone Present: kim lewis.  Indication: Lower Urinary Tract Symptoms.  Informed Consent: Risks, benefits, and potential adverse events were discussed and informed consent was obtained from the patient.  Prep: The patient was prepped with betadine.  Anesthesia:. Local anesthesia was administered intraurethrally with 2% lidocaine jelly.  Antibiotic prophylaxis: Ciprofloxacin.  Procedure Note:  Prostatic urethra:. The lateral and median  prostatic lobes were enlarged. An enlarged intravesical median lobe was visualized. ( small)  Bladder: Visulization was clear. The ureteral orifices were in the normal anatomic position bilaterally. Examination of the bladder demonstrated trabeculation, but no clot within the bladder and no diverticulum no erythematous mucosa, no edema and no cellules. The patient tolerated the procedure well.    Assessment Assessed  1. Benign prostatic hyperplasia with urinary obstruction (N40.1,N13.8) 2. Elevated prostate specific antigen (PSA) (R97.20) 3. Urinary frequency (R35.0) 4. Urinary incontinence (R32) 5. Urinary urgency (R39.15)  Severfee BPH, iwht 120 gram prostate, and Flow rate peak of 7cc/sec. He has cysto showing small intravesical lobe, and should have simple retropubic prostatectomy. We have disdcussed the procedure, and he will need pre-operative Physical therapy and post op physical therapy. to ensure best chance to regain full urinary continence.   Plan Benign prostatic hyperplasia with urinary obstruction  1. Follow-up Schedule Surgery Office  Follow-up  Status: Complete  Done: [id number]  Schedule open simple prostatectomy.   Discussion/Summary cc: Dr. Cathlean Cower.     Signatures Electronically signed by : Carolan Clines, M.D.; Mar 01 2015  3:16PM EST

## 2015-04-14 NOTE — Progress Notes (Signed)
Urology Progress Note  Day of Surgery   Subjective: Post op simple retropubic prostatectomy.     No acute urologic events overnight. Ambulation:   negative Flatus:    positive Bowel movement  negative  Pain: complete resolution  Objective:  Blood pressure 141/72, pulse 87, temperature 98.7 F (37.1 C), temperature source Oral, resp. rate 16, height 5\' 10"  (1.778 m), weight 119.098 kg (262 lb 9 oz), SpO2 98 %.  Physical Exam:  General:  No acute distress, awake Extremities: extremities normal, atraumatic, no cyanosis or edema Genitourinary:  Min JP output: 10-15cc Foley: bloody.        Recent Labs     04/14/15  1218  HGB  14.1    No results for input(s): NA, K, CL, CO2, BUN, CREATININE, CALCIUM, GFRNONAA, GFRAA in the last 72 hours.  Invalid input(s): MAGNESIUM   No results for input(s): INR, APTT in the last 72 hours.  Invalid input(s): PT   Invalid input(s): ABG  Assessment/Plan:  Continue any current medications. Wound looks good. Minimal JP output.  Possible discharge this weekend if pt looks good. Would need to have JP out pre -d/c.

## 2015-04-14 NOTE — Interval H&P Note (Signed)
History and Physical Interval Note:  04/14/2015 8:26 AM  Mario Donovan  has presented today for surgery, with the diagnosis of BPH  The various methods of treatment have been discussed with the patient and family. After consideration of risks, benefits and other options for treatment, the patient has consented to  Procedure(s): PROSTATECTOMY SIMPLE RETROPUBIC (N/A) as a surgical intervention .  The patient's history has been reviewed, patient examined, no change in status, stable for surgery.  I have reviewed the patient's chart and labs.  Questions were answered to the patient's satisfaction.     Davon Folta I Aundrey Elahi

## 2015-04-14 NOTE — Transfer of Care (Signed)
Immediate Anesthesia Transfer of Care Note  Patient: Mario Donovan  Procedure(s) Performed: Procedure(s): PROSTATECTOMY SIMPLE RETROPUBIC (N/A)  Patient Location: PACU  Anesthesia Type:General  Level of Consciousness:  awake, patient cooperative and responds to stimulation  Airway & Oxygen Therapy:Patient Spontanous Breathing and Patient connected to face mask oxgen  Post-op Assessment:  Report given to PACU RN and Post -op Vital signs reviewed and stable  Post vital signs:  Reviewed and stable  Last Vitals:  Filed Vitals:   04/14/15 0632 04/14/15 1150  BP: 134/72 142/69  Pulse: 96 83  Temp: 36.7 C 36.3 C  Resp: 18 12    Complications: No apparent anesthesia complications

## 2015-04-14 NOTE — Anesthesia Procedure Notes (Signed)
Procedure Name: Intubation Date/Time: 04/14/2015 9:38 AM Performed by: Freddie Breech Pre-anesthesia Checklist: Patient identified, Emergency Drugs available, Suction available, Patient being monitored and Timeout performed Patient Re-evaluated:Patient Re-evaluated prior to inductionOxygen Delivery Method: Circle system utilized Preoxygenation: Pre-oxygenation with 100% oxygen Intubation Type: IV induction Ventilation: Oral airway inserted - appropriate to patient size and Mask ventilation without difficulty Laryngoscope Size: Mac and 4 Tube type: Oral Tube size: 7.0 mm Number of attempts: 1 Airway Equipment and Method: Patient positioned with wedge pillow and Stylet Placement Confirmation: ETT inserted through vocal cords under direct vision,  positive ETCO2,  CO2 detector and breath sounds checked- equal and bilateral Secured at: 22 cm Tube secured with: Tape Dental Injury: Teeth and Oropharynx as per pre-operative assessment

## 2015-04-14 NOTE — Anesthesia Preprocedure Evaluation (Addendum)
Anesthesia Evaluation  Patient identified by MRN, date of birth, ID band Patient awake    Reviewed: Allergy & Precautions, H&P , NPO status , Patient's Chart, lab work & pertinent test results  History of Anesthesia Complications Negative for: history of anesthetic complications  Airway Mallampati: II  TM Distance: >3 FB Neck ROM: full    Dental no notable dental hx.    Pulmonary asthma ,    Pulmonary exam normal breath sounds clear to auscultation       Cardiovascular Normal cardiovascular exam Rhythm:regular Rate:Normal  Normal EKG stress test 01/2015   Neuro/Psych negative neurological ROS     GI/Hepatic negative GI ROS, Neg liver ROS,   Endo/Other  Morbid obesity  Renal/GU negative Renal ROS     Musculoskeletal   Abdominal   Peds  Hematology negative hematology ROS (+)   Anesthesia Other Findings Glaucoma of both eyes  Reproductive/Obstetrics negative OB ROS                            Anesthesia Physical Anesthesia Plan  ASA: II  Anesthesia Plan: General   Post-op Pain Management:    Induction: Intravenous  Airway Management Planned: Oral ETT  Additional Equipment:   Intra-op Plan:   Post-operative Plan: Extubation in OR  Informed Consent: I have reviewed the patients History and Physical, chart, labs and discussed the procedure including the risks, benefits and alternatives for the proposed anesthesia with the patient or authorized representative who has indicated his/her understanding and acceptance.   Dental Advisory Given  Plan Discussed with: Anesthesiologist and CRNA  Anesthesia Plan Comments:         Anesthesia Quick Evaluation

## 2015-04-14 NOTE — Discharge Instructions (Signed)
You have a Foley catheter in place which drains urine out of your bladder. There are two parts: one part has a number and likely a colored band around it - this port should NOT be manipulated; the other piece connects to the drainage tubing and drainage bag. Before discharge from the hospital, your nurse will instruct you how to care for your foley catheter.   A foley catheter drains your bladder by gravity. The drainage bag should always be below the level of your bladder. If you are wearing a leg bag, it should be below your waist, and at night, the bag should lay on the floor next to you in bed.   Sometimes, a piece of tissue or a blood clot can get caught in the foley catheter and cause it not to drain properly. In this case, you may leak urine around the catheter and you may feel like your bladder is full. In this case, you should disconnect the catheter from the drainage bag and flush the catheter with ~30cc of saline (available at [facility]). If this doesnt help, you should come in to be evaluated.  Other times, even though your foley catheter is draining well, you have the feeling that you have a full bladder, and you may leak around your catheter during painful bladder spasms. If this is the case, a medication called oxybutynin (or Ditropan) may help. It is normal to see some blood in your urine from time to time when you have a foley catheter. This can be due to irritation from the foley catheter inside the bladder. However, if your urine is the color of tomato juice with quarter-sized clots, this can clog the catheter, and you should call us for instructions. A physician will likely need to evaluate you.  If you are unable to get in touch with anyone and you feel it truly is an emergency, proceed to the nearest ER or call an ambulance.  1.  Activity:  You are encouraged to ambulate frequently (about every hour during waking hours) to help prevent blood clots from forming in your legs or lungs.   However, you should not engage in any heavy lifting (> 10-15 lbs), strenuous activity, or straining. 2. Diet: You should advance your diet as instructed by your physician.  It will be normal to have some bloating, nausea, and abdominal discomfort intermittently. 3. Prescriptions:  You will be provided a prescription for pain medication to take as needed.  If your pain is not severe enough to require the prescription pain medication, you may take extra strength Tylenol instead which will have less side effects.  You should also take a prescribed stool softener to avoid straining with bowel movements as the prescription pain medication may constipate you. 4. Incisions: You may remove your dressing bandages 48 hours after surgery if not removed in the hospital.  You will either have some small staples or special tissue glue at each of the incision sites. Once the bandages are removed (if present), the incisions may stay open to air.  You may start showering (but not soaking or bathing in water) the 2nd day after surgery and the incisions simply need to be patted dry after the shower.  No additional care is needed. 5. What to call us about: You should call the office [phone]) if you develop fever > 101 or develop persistent vomiting.  Please call the office to arrange a follow-up appointment with Dr. Gaynelle Arabian in 10 days. You will need to get a  study in the office that day called a cystogram as well.  You may restart your aspirin in 1 week following surgery

## 2015-04-14 NOTE — Anesthesia Postprocedure Evaluation (Signed)
Anesthesia Post Note  Patient: ABDURAHMAN FITZER  Procedure(s) Performed: Procedure(s) (LRB): PROSTATECTOMY SIMPLE RETROPUBIC (N/A)  Patient location during evaluation: PACU Anesthesia Type: General Level of consciousness: awake and alert Pain management: pain level controlled Vital Signs Assessment: post-procedure vital signs reviewed and stable Respiratory status: spontaneous breathing, nonlabored ventilation, respiratory function stable and patient connected to nasal cannula oxygen Cardiovascular status: blood pressure returned to baseline and stable Postop Assessment: no signs of nausea or vomiting Anesthetic complications: no Comments: Had initial nausea, improved with medications    Last Vitals:  Filed Vitals:   04/14/15 1215 04/14/15 1230  BP:  146/66  Pulse: 80 78  Temp:    Resp: 12 13    Last Pain:  Filed Vitals:   04/14/15 1237  PainSc: Asleep                 Zenaida Deed

## 2015-04-14 NOTE — Discharge Summary (Addendum)
Date of admission: 04/14/2015  Date of discharge: 04/15/2015  Admission diagnosis: BPH  Discharge diagnosis: BPH, s/p open simple prostatectomy  Secondary diagnoses:  Past Medical History  Diagnosis Date  . Glaucoma     BOTH EYES  . Elevated cholesterol   . Asthma   . BPH (benign prostatic hyperplasia)      History and Physical: For full details, please see admission history and physical. Briefly, Mario Donovan is a 70 y.o. year old patient with BPH admitted following open retropubic (trans-capsular) simple prostatectomy.   Hospital Course: s/p open retropubic (trans-capsular) simple prostatectomy 04/14/15. Tolerated the procedure well, transferred to the post-surgical floor. Foley catheter was draining appropriately without significant clot or need for irrigation. Diet was advanced and he was medlocked POD 1.  On POD 1 he was deemed appropriate for discharge with foley catheter in place. He was tolerating diet, pain was controlled on oral medications and he was ambulatory. He will follow-up in 10 days with Dr. Gaynelle Arabian with a cystogram.  Laboratory values:   Recent Labs  04/14/15 1218 04/15/15 0500  HGB 14.1 12.9*  HCT 42.0 39.5    Recent Labs  04/15/15 0500  CREATININE 0.88     Physical Exam:  Vital signs in last 24 hours: Temp:  [97.6 F (36.4 C)-98.7 F (37.1 C)] 97.9 F (36.6 C) (01/14 1048) Pulse Rate:  [71-87] 72 (01/14 1048) Resp:  [12-18] 16 (01/14 1048) BP: (114-141)/(57-76) 114/59 mmHg (01/14 1048) SpO2:  [96 %-100 %] 100 % (01/14 1048) Constitutional:  Alert and oriented, No acute distress Cardiovascular: Regular rate and rhythm, No JVD Respiratory: Normal respiratory effort, SORA GI: Abdomen is soft, nontender, nondistended, no abdominal masses Genitourinary: No CVAT. Foley draining clear, yellow urine.  Rectal: deferred Neurologic: Grossly intact, no focal deficits Psychiatric: Normal mood and affect   Disposition: Home  Discharge  instruction: The patient was instructed to be ambulatory but told to refrain from heavy lifting, strenuous activity, or driving.   Discharge medications:    Medication List    STOP taking these medications        aspirin 81 MG tablet      TAKE these medications        atorvastatin 20 MG tablet  Commonly known as:  LIPITOR  Take 1 tablet (20 mg total) by mouth daily.     dorzolamide 2 % ophthalmic solution  Commonly known as:  TRUSOPT  PLACE 1 DROP INTO BOTH EYES 3 TIMES A DAY     latanoprost 0.005 % ophthalmic solution  Commonly known as:  XALATAN  PLACE 1 DROP INTO BOTH EYES AT BEDTIME     multivitamin tablet  Take 1 tablet by mouth daily.     oxyCODONE-acetaminophen 5-325 MG tablet  Commonly known as:  ROXICET  Take 1 tablet by mouth every 4 (four) hours as needed for severe pain.     PROBIOTIC DAILY PO  Take 1 capsule by mouth daily.     Safflower Oil Oil  Take 1 tablet by mouth daily.     senna 8.6 MG Tabs tablet  Commonly known as:  SENOKOT  Take 1 tablet (8.6 mg total) by mouth daily.     sulfamethoxazole-trimethoprim 800-160 MG tablet  Commonly known as:  BACTRIM DS,SEPTRA DS  Take 1 tablet by mouth 2 (two) times daily.     SYMBICORT 160-4.5 MCG/ACT inhaler  Generic drug:  budesonide-formoterol  INHALE 2 PUFFS TWICE A DAY INHALATION     tamsulosin 0.4 MG Caps  capsule  Commonly known as:  FLOMAX  TAKE 1 CAPSULE DAILY at bedtime        Followup:  Follow-up Information    Follow up with SIGMUND I Gaynelle Arabian, MD In 1 week.   Specialty:  Urology   Why:  For suture removal   Contact information:   [place] [place] [id number] [phone]     10 days with Dr. Gaynelle Arabian with a cystogram prior

## 2015-04-15 DIAGNOSIS — N401 Enlarged prostate with lower urinary tract symptoms: Secondary | ICD-10-CM | POA: Diagnosis not present

## 2015-04-15 LAB — HEMOGLOBIN AND HEMATOCRIT, BLOOD
HCT: 39.5 % (ref 39.0–52.0)
HEMOGLOBIN: 12.9 g/dL — AB (ref 13.0–17.0)

## 2015-04-15 LAB — BASIC METABOLIC PANEL
ANION GAP: 7 (ref 5–15)
BUN: 15 mg/dL (ref 6–20)
CALCIUM: 8.8 mg/dL — AB (ref 8.9–10.3)
CO2: 24 mmol/L (ref 22–32)
Chloride: 110 mmol/L (ref 101–111)
Creatinine, Ser: 0.88 mg/dL (ref 0.61–1.24)
GFR calc Af Amer: >60 mL/min (ref >60)
GFR calc non Af Amer: >60 mL/min (ref >60)
GLUCOSE: 153 mg/dL — AB (ref 65–99)
Potassium: 4.5 mmol/L (ref 3.5–5.1)
Sodium: 141 mmol/L (ref 135–145)

## 2015-04-15 MED ORDER — ZOLPIDEM TARTRATE 5 MG PO TABS
5.0000 mg | ORAL_TABLET | Freq: Every evening | ORAL | Status: DC | PRN
  Administered 2015-04-15: 5 mg via ORAL
  Filled 2015-04-15: qty 1

## 2015-04-15 MED ORDER — FUROSEMIDE 10 MG/ML IJ SOLN
40.0000 mg | Freq: Once | INTRAMUSCULAR | Status: AC
  Administered 2015-04-15: 40 mg via INTRAVENOUS
  Filled 2015-04-15: qty 4

## 2015-04-15 NOTE — Progress Notes (Signed)
Urology Inpatient Progress Report BPH 04/14/2015  Status post open simple prostatectomy  Intv/Subj: No acute events overnight. Patient is without complaint. Pain is well-controlled.  Patient is up and ambulating without issue.  Patient denies any nausea/vomiting.  Past Medical History  Diagnosis Date  . Glaucoma     BOTH EYES  . Elevated cholesterol   . Asthma   . BPH (benign prostatic hyperplasia)    Current Facility-Administered Medications  Medication Dose Route Frequency Provider Last Rate Last Dose  . acetaminophen (TYLENOL) tablet 975 mg  975 mg Oral Q6H Star Age, MD   975 mg at 04/15/15 0908  . atorvastatin (LIPITOR) tablet 20 mg  20 mg Oral q1800 Star Age, MD      . budesonide-formoterol [facility]) 160-4.5 MCG/ACT inhaler 2 puff  2 puff Inhalation BID Star Age, MD   2 puff at 04/14/15 1930  . dextrose 5 % and 0.45 % NaCl with KCl 20 mEq/L infusion   Intravenous Continuous Star Age, MD 125 mL/hr at 04/15/15 0128    . diphenhydrAMINE (BENADRYL) injection 12.5-25 mg  12.5-25 mg Intravenous Q6H PRN Star Age, MD       Or  . diphenhydrAMINE (BENADRYL) 12.5 MG/5ML elixir 12.5-25 mg  12.5-25 mg Oral Q6H PRN Star Age, MD      . dorzolamide (TRUSOPT) 2 % ophthalmic solution 1 drop  1 drop Both Eyes TID Star Age, MD   1 drop at 04/15/15 1000  . ketorolac (TORADOL) 15 MG/ML injection 15 mg  15 mg Intravenous Q6H Star Age, MD   15 mg at 04/15/15 0908  . latanoprost (XALATAN) 0.005 % ophthalmic solution 1 drop  1 drop Both Eyes QHS Star Age, MD   1 drop at 04/14/15 2143  . morphine 2 MG/ML injection 2-4 mg  2-4 mg Intravenous Q2H PRN Star Age, MD      . ondansetron [facility]) injection 4 mg  4 mg Intravenous Q4H PRN Star Age, MD      . oxybutynin (DITROPAN) tablet 5 mg  5 mg Oral Q8H PRN Star Age, MD      . oxyCODONE (Oxy IR/ROXICODONE) immediate release tablet 10 mg  10 mg Oral Q4H PRN Star Age, MD      . senna-docusate  (Senokot-S) tablet 2 tablet  2 tablet Oral BID Star Age, MD   2 tablet at 04/15/15 0908  . zolpidem (AMBIEN) tablet 5 mg  5 mg Oral QHS PRN Carolan Clines, MD   5 mg at 04/15/15 0143     Objective: Vital: Filed Vitals:   04/14/15 2126 04/15/15 0012 04/15/15 0452 04/15/15 1048  BP: 121/60 117/57 126/63 114/59  Pulse: 79 74 73 72  Temp: 98 F (36.7 C) 98.7 F (37.1 C) 97.6 F (36.4 C) 97.9 F (36.6 C)  TempSrc: Oral Oral Oral Oral  Resp: 12 18 18 16   Height:      Weight:      SpO2: 99% 98% 99% 100%   I/Os: I/O last 3 completed shifts: In: 3965.4 [P.O.:720; I.V.:2185.4; Other:10; IV Piggyback:1050] Out: D000499 [Urine:2550; Drains:90; Blood:600]  Physical Exam:  General: Patient is in no apparent distress Lungs: Normal respiratory effort, chest expands symmetrically. GI: The abdomen is soft and nontender without mass. Incision is dressed, mild blood saturation on the inferior aspect. Foley catheter is draining dark bloody urine Ext: lower extremities symmetric  Lab Results:  Recent Labs  04/14/15 1218 04/15/15 0500  HGB 14.1 12.9*  HCT 42.0 39.5    Recent Labs  04/15/15 0500  NA 141  K 4.5  CL 110  CO2 24  GLUCOSE 153*  BUN 15  CREATININE 0.88  CALCIUM 8.8*   No results for input(s): LABPT, INR in the last 72 hours. No results for input(s): LABURIN in the last 72 hours. No results found for this or any previous visit.  Studies/Results:   Assessment: 1 Day Post-Op  Status post open simple prostatectomy, The patient has done extremely well postoperatively.  His pain is remarkably well controlled with fairly minimal pain medication.  He does have dark bloody urine.  This is made worse with walking.  He has not been on CBI. Plan: Our plan is to monitor the patient for another 24 hours.  I'm hopeful that his urine will clear.  I discussed concerned that he would develop clot retention based on the color of his urine today.  His wife is not entirely  comfortable with the idea of the patient today.  Ardis Hughs 04/15/2015, 12:28 PM

## 2015-04-15 NOTE — Progress Notes (Signed)
Pt & wife at bedside understands Foley care , leg bag teaching & incision care. They feel comfortable when they go home, all questions answered.

## 2015-04-17 ENCOUNTER — Telehealth: Payer: Self-pay

## 2015-04-17 NOTE — Telephone Encounter (Signed)
Pt is on TCM list. Pt had prostectomy and will follow up dr. Gaynelle Arabian.

## 2015-04-19 ENCOUNTER — Encounter (HOSPITAL_COMMUNITY): Payer: Self-pay | Admitting: Family Medicine

## 2015-04-19 ENCOUNTER — Emergency Department (HOSPITAL_COMMUNITY)
Admission: EM | Admit: 2015-04-19 | Discharge: 2015-04-19 | Disposition: A | Discharge status: Home / Self Care | Payer: Medicare Other | Attending: Emergency Medicine | Admitting: Emergency Medicine

## 2015-04-19 ENCOUNTER — Emergency Department (HOSPITAL_COMMUNITY): Payer: Medicare Other

## 2015-04-19 DIAGNOSIS — J45909 Unspecified asthma, uncomplicated: Secondary | ICD-10-CM | POA: Insufficient documentation

## 2015-04-19 DIAGNOSIS — Z79899 Other long term (current) drug therapy: Secondary | ICD-10-CM | POA: Diagnosis not present

## 2015-04-19 DIAGNOSIS — E7800 Pure hypercholesterolemia, unspecified: Secondary | ICD-10-CM | POA: Diagnosis not present

## 2015-04-19 DIAGNOSIS — Z87430 Personal history of prostatic dysplasia: Secondary | ICD-10-CM | POA: Insufficient documentation

## 2015-04-19 DIAGNOSIS — H409 Unspecified glaucoma: Secondary | ICD-10-CM | POA: Diagnosis not present

## 2015-04-19 DIAGNOSIS — K5900 Constipation, unspecified: Secondary | ICD-10-CM | POA: Insufficient documentation

## 2015-04-19 DIAGNOSIS — Z9079 Acquired absence of other genital organ(s): Secondary | ICD-10-CM | POA: Diagnosis not present

## 2015-04-19 DIAGNOSIS — R140 Abdominal distension (gaseous): Secondary | ICD-10-CM

## 2015-04-19 LAB — CBC WITH DIFFERENTIAL/PLATELET
Basophils Absolute: 0 10*3/uL (ref 0.0–0.1)
Basophils Relative: 0 %
Eosinophils Absolute: 0.1 10*3/uL (ref 0.0–0.7)
Eosinophils Relative: 1 %
HEMATOCRIT: 41.2 % (ref 39.0–52.0)
HEMOGLOBIN: 13.9 g/dL (ref 13.0–17.0)
LYMPHS ABS: 1.9 10*3/uL (ref 0.7–4.0)
Lymphocytes Relative: 20 %
MCH: 30 pg (ref 26.0–34.0)
MCHC: 33.7 g/dL (ref 30.0–36.0)
MCV: 89 fL (ref 78.0–100.0)
MONOS PCT: 8 %
Monocytes Absolute: 0.8 10*3/uL (ref 0.1–1.0)
NEUTROS ABS: 6.6 10*3/uL (ref 1.7–7.7)
NEUTROS PCT: 71 %
Platelets: 271 10*3/uL (ref 150–400)
RBC: 4.63 MIL/uL (ref 4.22–5.81)
RDW: 13.3 % (ref 11.5–15.5)
WBC: 9.4 10*3/uL (ref 4.0–10.5)

## 2015-04-19 LAB — COMPREHENSIVE METABOLIC PANEL
ALT: 21 U/L (ref 17–63)
ANION GAP: 11 (ref 5–15)
AST: 26 U/L (ref 15–41)
Albumin: 4.1 g/dL (ref 3.5–5.0)
Alkaline Phosphatase: 68 U/L (ref 38–126)
BUN: 19 mg/dL (ref 6–20)
CHLORIDE: 103 mmol/L (ref 101–111)
CO2: 26 mmol/L (ref 22–32)
Calcium: 9.2 mg/dL (ref 8.9–10.3)
Creatinine, Ser: 1.04 mg/dL (ref 0.61–1.24)
GFR calc non Af Amer: >60 mL/min (ref >60)
Glucose, Bld: 119 mg/dL — ABNORMAL HIGH (ref 65–99)
Potassium: 4.6 mmol/L (ref 3.5–5.1)
Sodium: 140 mmol/L (ref 135–145)
Total Bilirubin: 0.7 mg/dL (ref 0.3–1.2)
Total Protein: 7.1 g/dL (ref 6.5–8.1)

## 2015-04-19 MED ORDER — MINERAL OIL RE ENEM: 1.0000 | ENEMA | Freq: Once | RECTAL | Status: DC

## 2015-04-19 MED ORDER — SODIUM CHLORIDE 0.9 % IV BOLUS (SEPSIS)
500.0000 mL | Freq: Once | INTRAVENOUS | Status: AC
  Administered 2015-04-19: 500 mL via INTRAVENOUS

## 2015-04-19 MED ORDER — MINERAL OIL RE ENEM
1.0000 | ENEMA | Freq: Once | RECTAL | Status: AC
  Administered 2015-04-19: 1 via RECTAL
  Filled 2015-04-19: qty 1

## 2015-04-19 NOTE — ED Notes (Signed)
Patient had prostate surgery on Friday at [facility]. Pt was given stool softeners on Saturday at discharge. Pt's last bowel movement was on Monday. Pt reports it was a little more loose than normal and didn't feel like he emptied completely. Pt has took stool softeners x 2 yesterday, laxatives x 2 at 9:00, laxative x 1 at 13:00, Ducolax 16:10, 16:30 and Magnesium Cititrate at 21:00 last night. Pt was informed to come to ED if temp reached 100.0 or could not have a bowel movement. Pt's temp at home got up to 99.6. Pt has had a small, loose bowel movement since being in the ED.

## 2015-04-19 NOTE — Discharge Instructions (Signed)
Start taking miralax, twice daily until you have multiple regular bowel movements.  You can use a fleets enema, available over the counter this afternoon and again tomorrow if you have continued constipation.  Drink plenty of fluids.    Constipation, Adult Constipation is when a person has fewer than three bowel movements a week, has difficulty having a bowel movement, or has stools that are dry, hard, or larger than normal. As people grow older, constipation is more common. A low-fiber diet, not taking in enough fluids, and taking certain medicines may make constipation worse.  CAUSES   Certain medicines, such as antidepressants, pain medicine, iron supplements, antacids, and water pills.   Certain diseases, such as diabetes, irritable bowel syndrome (IBS), thyroid disease, or depression.   Not drinking enough water.   Not eating enough fiber-rich foods.   Stress or travel.   Lack of physical activity or exercise.   Ignoring the urge to have a bowel movement.   Using laxatives too much.  SIGNS AND SYMPTOMS   Having fewer than three bowel movements a week.   Straining to have a bowel movement.   Having stools that are hard, dry, or larger than normal.   Feeling full or bloated.   Pain in the lower abdomen.   Not feeling relief after having a bowel movement.  DIAGNOSIS  Your health care provider will take a medical history and perform a physical exam. Further testing may be done for severe constipation. Some tests may include:  A barium enema X-ray to examine your rectum, colon, and, sometimes, your small intestine.   A sigmoidoscopy to examine your lower colon.   A colonoscopy to examine your entire colon. TREATMENT  Treatment will depend on the severity of your constipation and what is causing it. Some dietary treatments include drinking more fluids and eating more fiber-rich foods. Lifestyle treatments may include regular exercise. If these diet and  lifestyle recommendations do not help, your health care provider may recommend taking over-the-counter laxative medicines to help you have bowel movements. Prescription medicines may be prescribed if over-the-counter medicines do not work.  HOME CARE INSTRUCTIONS   Eat foods that have a lot of fiber, such as fruits, vegetables, whole grains, and beans.  Limit foods high in fat and processed sugars, such as french fries, hamburgers, cookies, candies, and soda.   A fiber supplement may be added to your diet if you cannot get enough fiber from foods.   Drink enough fluids to keep your urine clear or pale yellow.   Exercise regularly or as directed by your health care provider.   Go to the restroom when you have the urge to go. Do not hold it.   Only take over-the-counter or prescription medicines as directed by your health care provider. Do not take other medicines for constipation without talking to your health care provider first.  [place] IF:   You have bright red blood in your stool.   Your constipation lasts for more than 4 days or gets worse.   You have abdominal or rectal pain.   You have thin, pencil-like stools.   You have unexplained weight loss. MAKE SURE YOU:   Understand these instructions.  Will watch your condition.  Will get help right away if you are not doing well or get worse.   This information is not intended to replace advice given to you by your health care provider. Make sure you discuss any questions you have with your health  care provider.   Document Released: 12/15/2003 Document Revised: 04/08/2014 Document Reviewed: 12/28/2012 Elsevier Interactive Patient Education 2016 Elsevier Inc. Full Liquid Diet A full liquid diet may be used:   To help you transition from a clear liquid diet to a soft diet.   When your body is healing and can only tolerate foods that are easy to digest.  Before or after certain a procedure,  test, or surgery (such as stomach or intestinal surgeries).   If you have trouble swallowing or chewing.  A full liquid diet includes fluids and foods that are liquid or will become liquid at room temperature. The full liquid diet gives you the proteins, fluids, salts, and minerals that you need for energy. If you continue this diet for more than 72 hours, talk to your health care provider about how many calories you need to consume. If you continue the diet for more than 5 days, talk to your health care provider about taking a multivitamin or a nutritional supplement. WHAT DO I NEED TO KNOW ABOUT A FULL LIQUID DIET?  You may have any liquid.  You may have any food that becomes a liquid at room temperature. The food is considered a liquid if it can be poured off a spoon at room temperature.  Drink one serving of citrus or vitamin C-enriched fruit juice daily. WHAT FOODS CAN I EAT? Grains Any grain food that can be pureed in soup (such as crackers, pasta, and rice). Hot cereal (such as farina or oatmeal) that has been blended. Talk to your health care provider or dietitian about these foods. Vegetables Pulp-free tomato or vegetable juice. Vegetables pureed in soup.  Fruits Fruit juice, including nectars and juices with pulp. Meats and Other Protein Sources Eggs in custard, eggnog mix, and eggs used in ice cream or pudding. Strained meats, like in baby food, may be allowed. Consult your health care provider.  Dairy Milk and milk-based beverages, including milk shakes and instant breakfast mixes. Smooth yogurt. Pureed cottage cheese. Avoid these foods if they are not well tolerated. Beverages All beverages, including liquid nutritional supplements. Ask your health care provider if you can have carbonated beverages. They may not be well tolerated. Condiments Iodized salt, pepper, spices, and flavorings. Cocoa powder. Vinegar, ketchup, yellow mustard, smooth sauces (such as hollandaise, cheese  sauce, or white sauce), and soy sauce. Sweets and Desserts Custard, smooth pudding. Flavored gelatin. Tapioca, junket. Plain ice cream, sherbet, fruit ices. Frozen ice pops, frozen fudge pops, pudding pops, and other frozen bars with cream. Syrups, including chocolate syrup. Sugar, honey, jelly.  Fats and Oils Margarine, butter, cream, sour cream, and oils. Other Broth and cream soups. Strained, broth-based soups. The items listed above may not be a complete list of recommended foods or beverages. Contact your dietitian for more options.  WHAT FOODS CAN I NOT EAT? Grains All breads. Grains are not allowed unless they are pureed into soup. Vegetables Vegetables are not allowed unless they are juiced, or cooked and pureed into soup. Fruits Fruits are not allowed unless they are juiced. Meats and Other Protein Sources Any meat or fish. Cooked or raw eggs. Nut butters.  Dairy Cheese.  Condiments Stone ground mustards. Fats and Oils Fats that are coarse or chunky. Sweets and Desserts Ice cream or other frozen desserts that have any solids in them or on top, such as nuts, chocolate chips, and pieces of cookies. Cakes. Cookies. Candy. Others Soups with chunks or pieces in them. The items listed above may  not be a complete list of foods and beverages to avoid. Contact your dietitian for more information.   This information is not intended to replace advice given to you by your health care provider. Make sure you discuss any questions you have with your health care provider.   Document Released: 03/18/2005 Document Revised: 03/23/2013 Document Reviewed: 01/21/2013 Elsevier Interactive Patient Education [employer].

## 2015-04-19 NOTE — ED Provider Notes (Signed)
CSN: [id number]     Arrival date & time 04/19/15  0017 History   By signing my name below, I, Forrestine Him, attest that this documentation has been prepared under the direction and in the presence of Quintella Reichert, MD.  Electronically Signed: Forrestine Him, ED Scribe. 04/19/2015. 2:41 AM.   Chief Complaint  Patient presents with  . Constipation   The history is provided by the patient. No language interpreter was used.    HPI Comments: Mario Donovan is a 70 y.o. male who presents to the Emergency Department complaining of constant, ongoing constipation x 2 days. Last bowel movement Monday 1/16. Pt also reports ongoing abdominal bloating and mild intermittent nausea. Magnesium Citrate, laxatives, and prescribed stool softeners attempted at home with mild improvement. Pt states he noted his stools were a little more loose, but minimal after attempting Magnesium Citrate at 9:00 PM yesterday, however, pt states "i don't feel like i emptied out completely". No recent fever or chills. Pt recently underwent a prostatectomy 04/14/2015 performed by Dr. Gaynelle Arabian.  PCP: Scarlette Calico, MD    Past Medical History  Diagnosis Date  . Glaucoma     BOTH EYES  . Elevated cholesterol   . Asthma   . BPH (benign prostatic hyperplasia)    Past Surgical History  Procedure Laterality Date  . Tonsillectomy  AS CHILD  . Colonscopy    . Prostatectomy N/A 04/14/2015    Procedure: PROSTATECTOMY SIMPLE RETROPUBIC;  Surgeon: Carolan Clines, MD;  Location: [facility];  Service: Urology;  Laterality: N/A;   Family History  Problem Relation Age of Onset  . Cancer Mother 35    stomach cancer  . Heart disease Father   . Hyperlipidemia Father   . Early death Brother 83    MS  . Stroke Neg Hx   . COPD Neg Hx   . Diabetes Neg Hx   . Hypertension Neg Hx   . Kidney disease Neg Hx    Social History  Substance Use Topics  . Smoking status: Never Smoker   . Smokeless tobacco: Never Used  . Alcohol Use: No     Review of Systems  A complete 10 system review of systems was obtained and all systems are negative except as noted in the HPI and PMH.     Allergies  Review of patient's allergies indicates no known allergies.  Home Medications   Prior to Admission medications   Medication Sig Start Date End Date Taking? Authorizing Provider  acetaminophen (TYLENOL) 500 MG tablet Take 1,000 mg by mouth every 6 (six) hours as needed for mild pain.   Yes Historical Provider, MD  atorvastatin (LIPITOR) 20 MG tablet Take 1 tablet (20 mg total) by mouth daily. 01/19/15  Yes Janith Lima, MD  bisacodyl (DULCOLAX) 10 MG suppository Place 10 mg rectally as needed for moderate constipation.   Yes Historical Provider, MD  dorzolamide (TRUSOPT) 2 % ophthalmic solution PLACE 1 DROP INTO BOTH EYES 3 TIMES A DAY 02/24/15  Yes Historical Provider, MD  latanoprost (XALATAN) 0.005 % ophthalmic solution PLACE 1 DROP INTO BOTH EYES AT BEDTIME 03/06/15  Yes Historical Provider, MD  Multiple Vitamin (MULTIVITAMIN) tablet Take 1 tablet by mouth daily.   Yes Historical Provider, MD  oxyCODONE-acetaminophen (ROXICET) 5-325 MG tablet Take 1 tablet by mouth every 4 (four) hours as needed for severe pain. 04/14/15  Yes Star Age, MD  Probiotic Product (PROBIOTIC DAILY PO) Take 1 capsule by mouth daily.   Yes Historical Provider,  MD  senna (SENOKOT) 8.6 MG TABS tablet Take 1 tablet (8.6 mg total) by mouth daily. 04/14/15  Yes Star Age, MD  sulfamethoxazole-trimethoprim (BACTRIM DS,SEPTRA DS) 800-160 MG tablet Take 1 tablet by mouth 2 (two) times daily. 04/14/15 04/20/15 Yes Star Age, MD  SYMBICORT 160-4.5 MCG/ACT inhaler INHALE 2 PUFFS TWICE A DAY INHALATION 11/30/14  Yes Historical Provider, MD   Triage Vitals: BP 118/70 mmHg  Pulse 88  Temp(Src) 98.1 F (36.7 C) (Oral)  Resp 16  SpO2 94%   Physical Exam  Constitutional: He is oriented to person, place, and time. He appears well-developed and well-nourished.   HENT:  Head: Normocephalic and atraumatic.  Cardiovascular: Normal rate and regular rhythm.   No murmur heard. Pulmonary/Chest: Effort normal and breath sounds normal. No respiratory distress.  Abdominal: Soft. He exhibits distension (Mild). There is tenderness. There is no rebound and no guarding.  Minimal tenderness Healing lower midline abdominal incision with slight edema on lower abdomen without erythema   Genitourinary:  Empty rectal vault, non tender  Musculoskeletal: He exhibits no edema or tenderness.  Neurological: He is alert and oriented to person, place, and time.  Skin: Skin is warm and dry.  Psychiatric: He has a normal mood and affect. His behavior is normal.  Nursing note and vitals reviewed.   ED Course  Procedures (including critical care time)  DIAGNOSTIC STUDIES: Oxygen Saturation is 95% on RA, adequate by my interpretation.    COORDINATION OF CARE: 2:37 AM- Will order DG abd acute with chest, CMP, and CBC. Will give fluids. Discussed treatment plan with pt at bedside and pt agreed to plan.     Labs Review Labs Reviewed  COMPREHENSIVE METABOLIC PANEL - Abnormal; Notable for the following:    Glucose, Bld 119 (*)    All other components within normal limits  CBC WITH DIFFERENTIAL/PLATELET    Imaging Review Dg Abd Acute W/chest  04/19/2015  CLINICAL DATA:  Acute onset of generalized abdominal distention and pain. Status post recent prostatectomy. Lack of bowel movements. Initial encounter. EXAM: DG ABDOMEN ACUTE W/ 1V CHEST COMPARISON:  None. FINDINGS: The lungs are well-aerated and clear. There is no evidence of focal opacification, pleural effusion or pneumothorax. The cardiomediastinal silhouette is mildly enlarged. The visualized bowel gas pattern is unremarkable. Scattered fluid and air are seen within the colon; there is no evidence of small bowel dilatation to suggest obstruction. No free intra-abdominal air is identified on the provided upright view.  No acute osseous abnormalities are seen; the sacroiliac joints are unremarkable in appearance. IMPRESSION: 1. Unremarkable bowel gas pattern; no free intra-abdominal air seen. Scattered fluid and air noted within the colon. 2. Mild cardiomegaly.  Lungs remain grossly clear. Electronically Signed   By: Garald Balding M.D.   On: 04/19/2015 03:10   I have personally reviewed and evaluated these images and lab results as part of my medical decision-making.   EKG Interpretation None      MDM   Final diagnoses:  Abdominal bloating  Constipation, unspecified constipation type   Patient here postoperative from prostatectomy with abdominal bloating and sensation of constipation. No fecal impaction on examination. No evidence of bowel obstruction at this time. Discussed home care for constipation and transitioning back to liquid diet until he moves his bowels. Discussed urology follow-up as well as close return precautions. No evidence of acute infection at this time.  I personally performed the services described in this documentation, which was scribed in my presence. The recorded information has  been reviewed and is accurate.   Quintella Reichert, MD 04/19/15 [phone]

## 2015-09-30 ENCOUNTER — Telehealth: Payer: Self-pay

## 2015-09-30 NOTE — Telephone Encounter (Signed)
Patient is on the list for [employer] 2017 and may be a good candidate for an AWV in 2017. Please let me know if/when appt is scheduled.   

## 2015-11-27 ENCOUNTER — Other Ambulatory Visit: Payer: Self-pay | Admitting: Allergy

## 2015-11-27 ENCOUNTER — Ambulatory Visit
Admission: RE | Admit: 2015-11-27 | Discharge: 2015-11-27 | Disposition: A | Discharge status: Home / Self Care | Payer: Medicare Other | Source: Ambulatory Visit | Attending: Allergy | Admitting: Allergy

## 2015-11-27 DIAGNOSIS — J4530 Mild persistent asthma, uncomplicated: Secondary | ICD-10-CM

## 2015-12-28 ENCOUNTER — Other Ambulatory Visit (INDEPENDENT_AMBULATORY_CARE_PROVIDER_SITE_OTHER): Payer: Medicare Other

## 2015-12-28 ENCOUNTER — Ambulatory Visit (INDEPENDENT_AMBULATORY_CARE_PROVIDER_SITE_OTHER): Payer: Medicare Other | Admitting: Internal Medicine

## 2015-12-28 ENCOUNTER — Encounter: Payer: Self-pay | Admitting: Internal Medicine

## 2015-12-28 VITALS — BP 118/68 | HR 84 | Temp 98.4°F | Resp 16 | Ht 70.0 in | Wt 267.5 lb

## 2015-12-28 DIAGNOSIS — E785 Hyperlipidemia, unspecified: Secondary | ICD-10-CM

## 2015-12-28 DIAGNOSIS — R7303 Prediabetes: Secondary | ICD-10-CM | POA: Diagnosis not present

## 2015-12-28 DIAGNOSIS — C61 Malignant neoplasm of prostate: Secondary | ICD-10-CM | POA: Diagnosis not present

## 2015-12-28 DIAGNOSIS — Z23 Encounter for immunization: Secondary | ICD-10-CM | POA: Diagnosis not present

## 2015-12-28 DIAGNOSIS — E669 Obesity, unspecified: Secondary | ICD-10-CM

## 2015-12-28 DIAGNOSIS — Z0000 Encounter for general adult medical examination without abnormal findings: Secondary | ICD-10-CM | POA: Diagnosis not present

## 2015-12-28 LAB — HEMOGLOBIN A1C: Hgb A1c MFr Bld: 6 % (ref 4.6–6.5)

## 2015-12-28 LAB — TSH: TSH: 2.56 u[IU]/mL (ref 0.35–4.50)

## 2015-12-28 LAB — COMPREHENSIVE METABOLIC PANEL
ALT: 19 U/L (ref 0–53)
AST: 16 U/L (ref 0–37)
Albumin: 4.2 g/dL (ref 3.5–5.2)
Alkaline Phosphatase: 80 U/L (ref 39–117)
BILIRUBIN TOTAL: 0.4 mg/dL (ref 0.2–1.2)
BUN: 19 mg/dL (ref 6–23)
CALCIUM: 9.2 mg/dL (ref 8.4–10.5)
CHLORIDE: 107 meq/L (ref 96–112)
CO2: 30 meq/L (ref 19–32)
Creatinine, Ser: 0.95 mg/dL (ref 0.40–1.50)
GFR: 83.1 mL/min (ref >60.00)
Glucose, Bld: 102 mg/dL — ABNORMAL HIGH (ref 70–99)
POTASSIUM: 5.2 meq/L — AB (ref 3.5–5.1)
Sodium: 143 mEq/L (ref 135–145)
Total Protein: 6.8 g/dL (ref 6.0–8.3)

## 2015-12-28 LAB — CBC WITH DIFFERENTIAL/PLATELET
BASOS ABS: 0 10*3/uL (ref 0.0–0.1)
BASOS PCT: 0.3 % (ref 0.0–3.0)
EOS ABS: 0.2 10*3/uL (ref 0.0–0.7)
Eosinophils Relative: 3.3 % (ref 0.0–5.0)
HEMATOCRIT: 46.7 % (ref 39.0–52.0)
HEMOGLOBIN: 15.9 g/dL (ref 13.0–17.0)
LYMPHS PCT: 27.2 % (ref 12.0–46.0)
Lymphs Abs: 1.9 10*3/uL (ref 0.7–4.0)
MCHC: 34.2 g/dL (ref 30.0–36.0)
MCV: 89.1 fl (ref 78.0–100.0)
Monocytes Absolute: 0.6 10*3/uL (ref 0.1–1.0)
Monocytes Relative: 8.4 % (ref 3.0–12.0)
Neutro Abs: 4.1 10*3/uL (ref 1.4–7.7)
Neutrophils Relative %: 60.8 % (ref 43.0–77.0)
Platelets: 261 10*3/uL (ref 150.0–400.0)
RBC: 5.24 Mil/uL (ref 4.22–5.81)
RDW: 14 % (ref 11.5–15.5)
WBC: 6.8 10*3/uL (ref 4.0–10.5)

## 2015-12-28 LAB — LIPID PANEL
CHOL/HDL RATIO: 3
CHOLESTEROL: 127 mg/dL (ref 0–200)
HDL: 44.9 mg/dL (ref >39.00)
LDL CALC: 74 mg/dL (ref 0–99)
NONHDL: 81.91
Triglycerides: 42 mg/dL (ref 0.0–149.0)
VLDL: 8.4 mg/dL (ref 0.0–40.0)

## 2015-12-28 LAB — PSA: PSA: 0.1 ng/mL (ref 0.10–4.00)

## 2015-12-28 NOTE — Patient Instructions (Signed)

## 2015-12-28 NOTE — Assessment & Plan Note (Signed)

## 2015-12-28 NOTE — Progress Notes (Signed)
Subjective:  Patient ID: Mario Donovan, male    DOB: 1945-07-22  Age: 70 y.o. MRN: [id number]  CC: Annual Exam and Hyperlipidemia   HPI [name] presents for a CPX/AWV.  He feels well and offers no complaints. He is having no side effects from the statin therapy. He is being treated for asthma by an allergist.  He is status post prostatectomy earlier this year for concerns about prostate cancer. He has recovered nicely and offers no urinary symptoms.  Past Medical History:  Diagnosis Date  . Asthma   . BPH (benign prostatic hyperplasia)   . Elevated cholesterol   . Glaucoma    BOTH EYES   Past Surgical History:  Procedure Laterality Date  . COLONSCOPY    . PROSTATECTOMY N/A 04/14/2015   Procedure: PROSTATECTOMY SIMPLE RETROPUBIC;  Surgeon: Carolan Clines, MD;  Location: [facility];  Service: Urology;  Laterality: N/A;  . TONSILLECTOMY  AS CHILD    reports that he has never smoked. He has never used smokeless tobacco. He reports that he does not drink alcohol or use drugs. family history includes Cancer (age of onset: 23) in his mother; Early death (age of onset: 82) in his brother; Heart disease in his father; Hyperlipidemia in his father. No Known Allergies  Outpatient Medications Prior to Visit  Medication Sig Dispense Refill  . acetaminophen (TYLENOL) 500 MG tablet Take 1,000 mg by mouth every 6 (six) hours as needed for mild pain.    Marland Kitchen atorvastatin (LIPITOR) 20 MG tablet Take 1 tablet (20 mg total) by mouth daily. 90 tablet 3  . bisacodyl (DULCOLAX) 10 MG suppository Place 10 mg rectally as needed for moderate constipation.    . dorzolamide (TRUSOPT) 2 % ophthalmic solution PLACE 1 DROP INTO BOTH EYES 3 TIMES A DAY  11  . latanoprost (XALATAN) 0.005 % ophthalmic solution PLACE 1 DROP INTO BOTH EYES AT BEDTIME  12  . Multiple Vitamin (MULTIVITAMIN) tablet Take 1 tablet by mouth daily.    . Probiotic Product (PROBIOTIC DAILY PO) Take 1 capsule by mouth daily.      Marland Kitchen senna (SENOKOT) 8.6 MG TABS tablet Take 1 tablet (8.6 mg total) by mouth daily. 120 each 0  . oxyCODONE-acetaminophen (ROXICET) 5-325 MG tablet Take 1 tablet by mouth every 4 (four) hours as needed for severe pain. 30 tablet 0  . SYMBICORT 160-4.5 MCG/ACT inhaler INHALE 2 PUFFS TWICE A DAY INHALATION  0   No facility-administered medications prior to visit.     ROS Review of Systems  Constitutional: Negative.  Negative for appetite change, chills, diaphoresis, fatigue and unexpected weight change.  HENT: Negative.  Negative for facial swelling, sore throat, trouble swallowing and voice change.   Eyes: Negative.  Negative for photophobia and visual disturbance.  Respiratory: Negative for cough, choking, chest tightness, shortness of breath and stridor.   Cardiovascular: Negative.  Negative for chest pain, palpitations and leg swelling.  Gastrointestinal: Negative.  Negative for abdominal pain, constipation, diarrhea, nausea and vomiting.  Endocrine: Negative.   Genitourinary: Negative for difficulty urinating, dysuria, frequency, hematuria and scrotal swelling.  Musculoskeletal: Negative.  Negative for arthralgias, back pain, joint swelling, myalgias and neck pain.  Skin: Negative.  Negative for color change and rash.  Allergic/Immunologic: Negative.   Neurological: Negative.  Negative for dizziness, tremors, syncope, light-headedness, numbness and headaches.  Hematological: Negative.  Negative for adenopathy. Does not bruise/bleed easily.  Psychiatric/Behavioral: Negative.     Objective:  BP 118/68 (BP Location: Left Arm,  Patient Position: Sitting, Cuff Size: Normal)   Pulse 84   Temp 98.4 F (36.9 C) (Oral)   Resp 16   Ht 5\' 10"  (1.778 m)   Wt 267 lb 8 oz (121.3 kg)   SpO2 97%   BMI 38.38 kg/m   BP Readings from Last 3 Encounters:  12/28/15 118/68  04/19/15 118/70  04/15/15 (!) 114/59    Wt Readings from Last 3 Encounters:  12/28/15 267 lb 8 oz (121.3 kg)  04/14/15  262 lb 9 oz (119.1 kg)  04/05/15 262 lb 9.6 oz (119.1 kg)    Physical Exam  Constitutional: He is oriented to person, place, and time. No distress.  HENT:  Mouth/Throat: No oropharyngeal exudate.  Eyes: Conjunctivae are normal. Right eye exhibits no discharge. Left eye exhibits no discharge. No scleral icterus.  Neck: Normal range of motion. Neck supple. No JVD present. No tracheal deviation present. No thyromegaly present.  Cardiovascular: Normal rate, regular rhythm, normal heart sounds and intact distal pulses.  Exam reveals no gallop and no friction rub.   No murmur heard. Pulmonary/Chest: Effort normal and breath sounds normal. No stridor. No respiratory distress. He has no wheezes. He has no rales. He exhibits no tenderness.  Abdominal: Soft. Bowel sounds are normal. He exhibits no distension and no mass. There is no tenderness. There is no rebound and no guarding. Hernia confirmed negative in the right inguinal area and confirmed negative in the left inguinal area.  Genitourinary: Rectum normal, prostate normal, testes normal and penis normal. Rectal exam shows no internal hemorrhoid, no fissure, no mass, no tenderness, anal tone normal and guaiac negative stool. Prostate is not enlarged and not tender. Right testis shows no mass, no swelling and no tenderness. Right testis is descended. Left testis shows no mass, no swelling and no tenderness. Left testis is descended. Circumcised. No penile erythema or penile tenderness. No discharge found.  Musculoskeletal: Normal range of motion. He exhibits no edema, tenderness or deformity.  Lymphadenopathy:    He has no cervical adenopathy.       Right: No inguinal adenopathy present.       Left: No inguinal adenopathy present.  Neurological: He is oriented to person, place, and time.  Skin: Skin is warm and dry. No rash noted. He is not diaphoretic. No erythema. No pallor.  Vitals reviewed.   Lab Results  Component Value Date   WBC 6.8  12/28/2015   HGB 15.9 12/28/2015   HCT 46.7 12/28/2015   PLT 261.0 12/28/2015   GLUCOSE 102 (H) 12/28/2015   CHOL 127 12/28/2015   TRIG 42.0 12/28/2015   HDL 44.90 12/28/2015   LDLCALC 74 12/28/2015   ALT 19 12/28/2015   AST 16 12/28/2015   NA 143 12/28/2015   K 5.2 (H) 12/28/2015   CL 107 12/28/2015   CREATININE 0.95 12/28/2015   BUN 19 12/28/2015   CO2 30 12/28/2015   TSH 2.56 12/28/2015   PSA 0.10 12/28/2015   HGBA1C 6.0 12/28/2015    Dg Chest 2 View  Result Date: 11/27/2015 CLINICAL DATA:  Baseline chest x-ray for cough an allergy evaluation. History of asthma EXAM: CHEST  2 VIEW COMPARISON:  Chest x-ray of April 19, 2015 FINDINGS: The lungs are adequately inflated. There is no focal infiltrate. There is no pleural effusion. The heart and pulmonary vascularity are normal. The mediastinum is normal in width. There is calcification in the wall of the aortic arch. The bony thorax is unremarkable. IMPRESSION: There is no active  cardiopulmonary disease. Aortic atherosclerosis. Electronically Signed   By: David  Martinique M.D.   On: 11/27/2015 13:11    Assessment & Plan:   Nehemias was seen today for annual exam and hyperlipidemia.  Diagnoses and all orders for this visit:  Prostate cancer [facility])- his PSA remains very low so I am not concerned about a recurrence of prostate cancer at this time. -     CBC with Differential/Platelet; Future -     PSA; Future  Hyperlipidemia with target LDL less than 130- he is achieved his LDL goal is doing well on the statin. -     Lipid panel; Future -     CBC with Differential/Platelet; Future -     Comprehensive metabolic panel; Future -     TSH; Future  Obesity (BMI 35.0-39.9 without comorbidity) ([place])- he agrees to work on his lifestyle modifications to lose weight.  Prediabetes- his A1c is at 6.0%, he has prediabetes but no medications are needed at this time. -     Hemoglobin A1c; Future  Routine general medical examination at a health  care facility  Need for prophylactic vaccination and inoculation against influenza -     Flu vaccine HIGH DOSE PF (Fluzone High dose)   I have discontinued Mr. [place] SYMBICORT and oxyCODONE-acetaminophen. I am also having him maintain his multivitamin, atorvastatin, latanoprost, dorzolamide, Probiotic Product (PROBIOTIC DAILY PO), senna, acetaminophen, bisacodyl, BREO ELLIPTA, montelukast, [employer] OMEGA-3, and aspirin EC.  Meds ordered this encounter  Medications  . BREO ELLIPTA 200-25 MCG/INH AEPB  . montelukast (SINGULAIR) 10 MG tablet  . Flax Oil-Fish Oil-Borage Oil ([facility] OMEGA-3) CAPS    Sig: Take by mouth.  Marland Kitchen aspirin EC 81 MG tablet    Sig: Take 81 mg by mouth daily.   See AVS for instructions about healthy living and anticipatory guidance.  Follow-up: Return in about 6 months (around 06/26/2016).  Scarlette Calico, MD

## 2015-12-28 NOTE — Progress Notes (Signed)
Pre visit review using our clinic review tool, if applicable. No additional management support is needed unless otherwise documented below in the visit note. 

## 2016-01-16 ENCOUNTER — Other Ambulatory Visit: Payer: Self-pay | Admitting: Internal Medicine

## 2016-01-16 DIAGNOSIS — E785 Hyperlipidemia, unspecified: Secondary | ICD-10-CM

## 2016-10-29 IMAGING — CR DG CHEST 2V
2 series · 2 of 2 positions shown · non-contrast
Comparison: Chest x-ray of April 19, 2015

CLINICAL DATA: Baseline chest x-ray for cough an allergy
evaluation. History of asthma

EXAM:
CHEST  2 VIEW

[w chest pa]
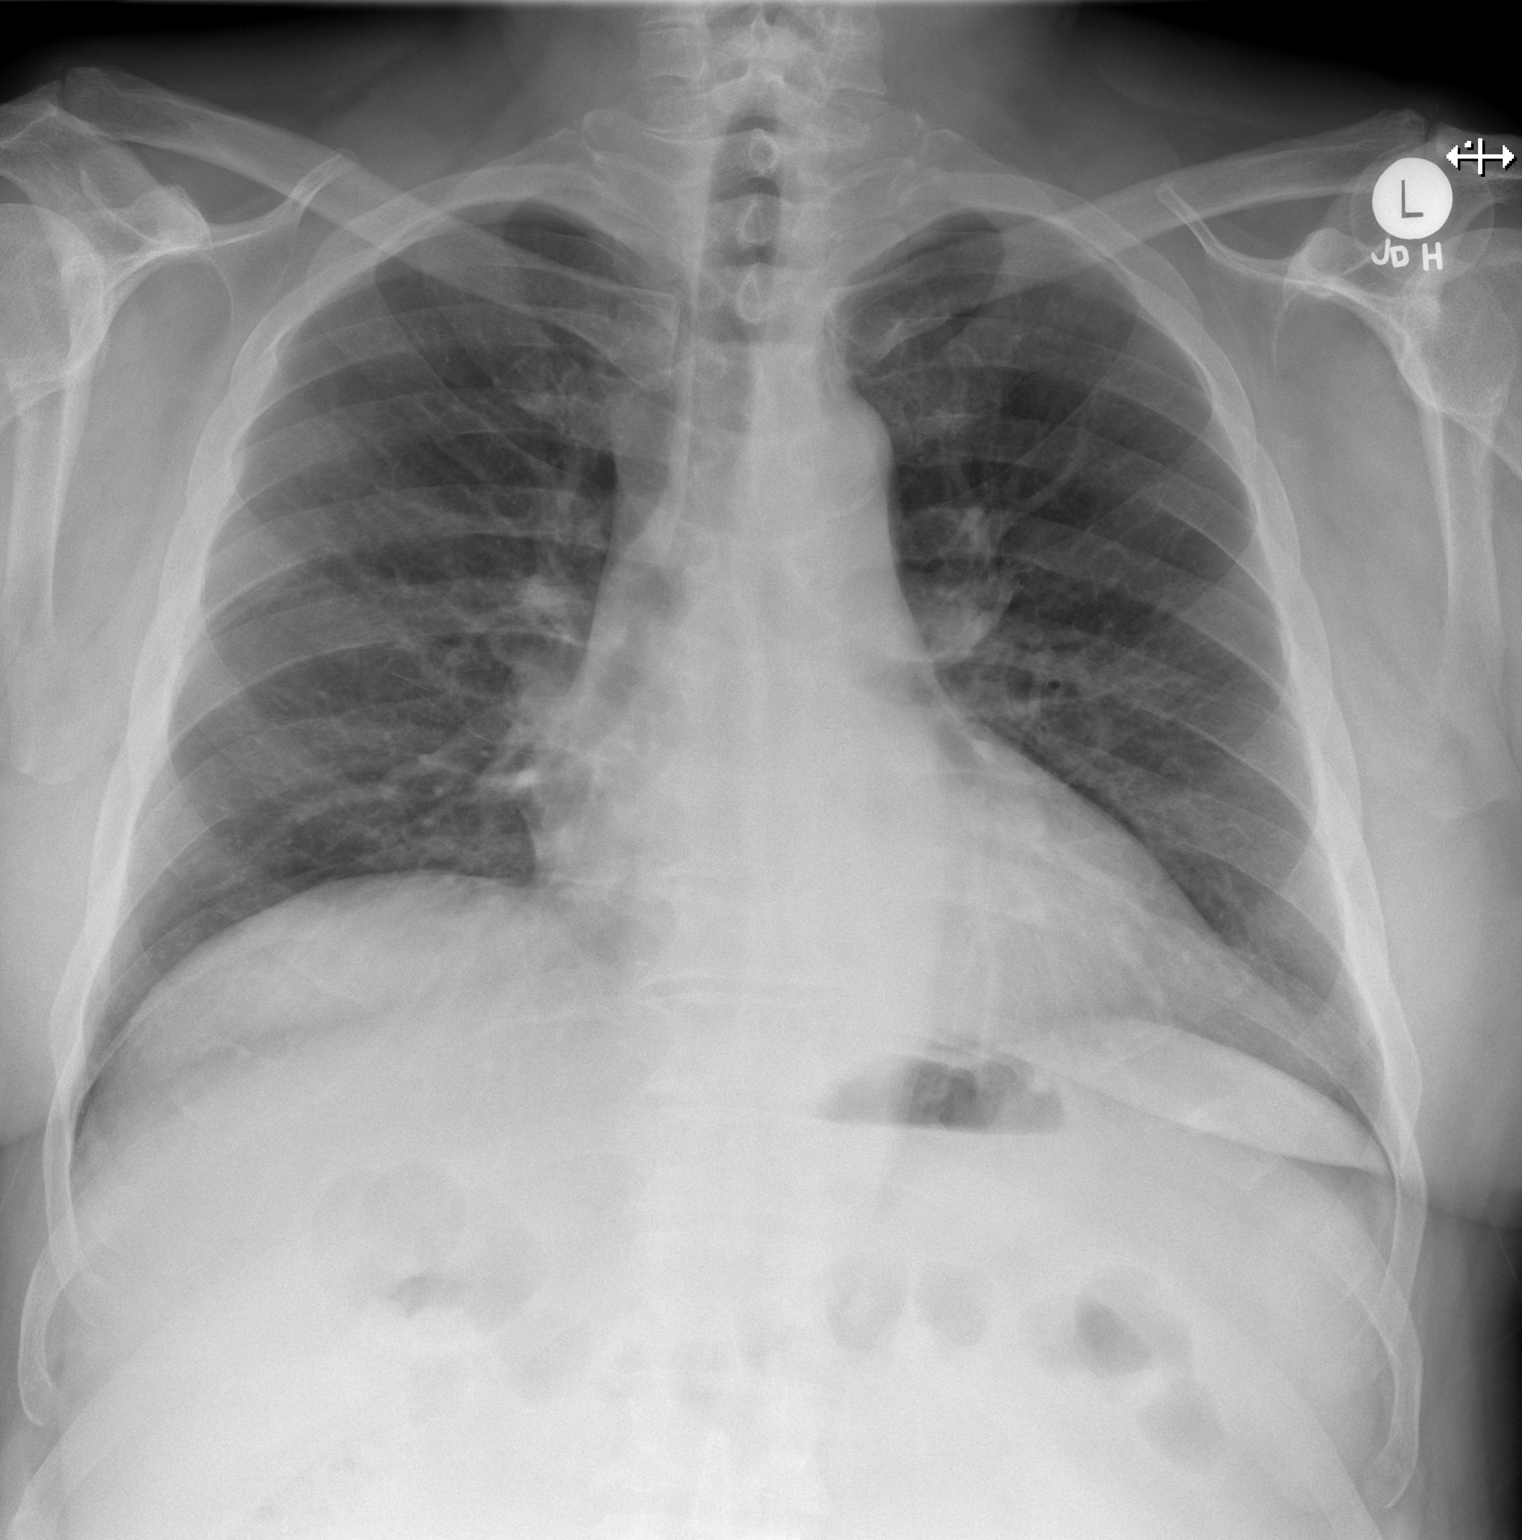

[w chest lat]
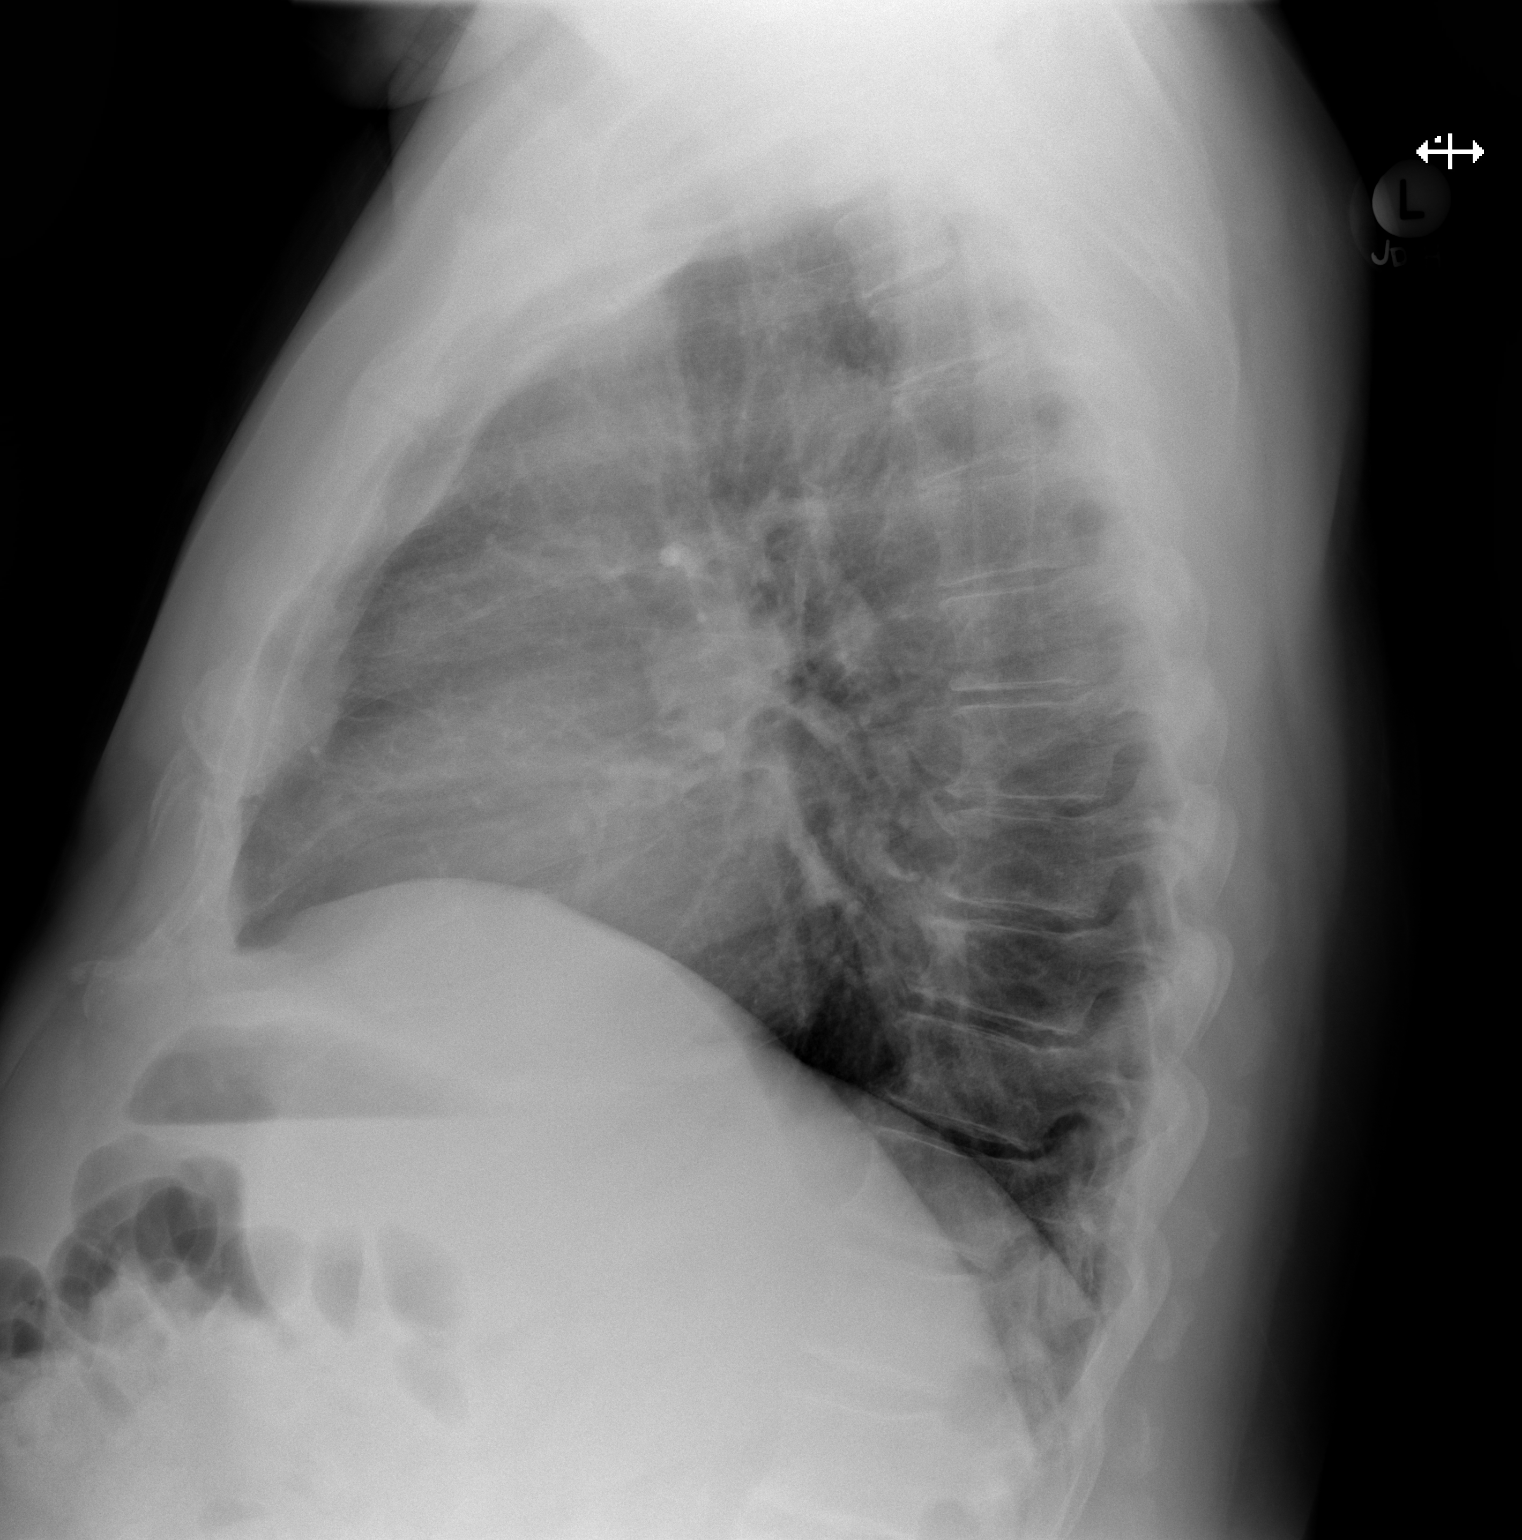

[2 of 2 positions shown; findings below may reference images not displayed]

FINDINGS: The lungs are adequately inflated. There is no focal infiltrate.
There is no pleural effusion. The heart and pulmonary vascularity
are normal. The mediastinum is normal in width. There is
calcification in the wall of the aortic arch. The bony thorax is
unremarkable.
IMPRESSION: There is no active cardiopulmonary disease.

Aortic atherosclerosis.

## 2017-01-11 ENCOUNTER — Other Ambulatory Visit: Payer: Self-pay | Admitting: Internal Medicine

## 2017-01-11 DIAGNOSIS — E785 Hyperlipidemia, unspecified: Secondary | ICD-10-CM

## 2017-05-22 ENCOUNTER — Telehealth: Payer: Self-pay | Admitting: Internal Medicine

## 2017-05-22 NOTE — Telephone Encounter (Signed)
This has been updated.

## 2017-05-22 NOTE — Telephone Encounter (Signed)
Copied from [place]. Topic: Quick Communication - See Telephone Encounter >> May 22, 2017 11:22 AM Robina Ade, Helene Kelp D wrote: CRM for notification. See Telephone encounter for: 05/22/17. Patient wife called to say that patient got his Flu shot 12/19/16 at his employer. If this can be updated in his chart please.

## 2017-06-19 ENCOUNTER — Ambulatory Visit: Payer: Medicare Other

## 2017-10-13 LAB — FECAL OCCULT BLOOD, IMMUNOCHEMICAL: IMMUNOLOGICAL FECAL OCCULT BLOOD TEST: NEGATIVE

## 2017-10-30 ENCOUNTER — Encounter: Payer: Self-pay | Admitting: Internal Medicine

## 2018-01-21 ENCOUNTER — Encounter: Payer: Self-pay | Admitting: Gastroenterology
